# Patient Record
Sex: Male | Born: 1955 | Race: White | Hispanic: No | Marital: Married | Smoking: Never smoker
Health system: Southern US, Community
[De-identification: ages and names within clinical notes are randomized; demographics above are authoritative.]

## PROBLEM LIST (undated history)

## (undated) DIAGNOSIS — F419 Anxiety disorder, unspecified: Secondary | ICD-10-CM

## (undated) DIAGNOSIS — K219 Gastro-esophageal reflux disease without esophagitis: Secondary | ICD-10-CM

## (undated) DIAGNOSIS — R001 Bradycardia, unspecified: Secondary | ICD-10-CM

## (undated) DIAGNOSIS — K760 Fatty (change of) liver, not elsewhere classified: Secondary | ICD-10-CM

## (undated) DIAGNOSIS — I340 Nonrheumatic mitral (valve) insufficiency: Secondary | ICD-10-CM

## (undated) DIAGNOSIS — M1990 Unspecified osteoarthritis, unspecified site: Secondary | ICD-10-CM

## (undated) DIAGNOSIS — I341 Nonrheumatic mitral (valve) prolapse: Secondary | ICD-10-CM

## (undated) DIAGNOSIS — C801 Malignant (primary) neoplasm, unspecified: Secondary | ICD-10-CM

## (undated) DIAGNOSIS — R519 Headache, unspecified: Secondary | ICD-10-CM

## (undated) DIAGNOSIS — E785 Hyperlipidemia, unspecified: Secondary | ICD-10-CM

## (undated) HISTORY — PX: DRUG INDUCED ENDOSCOPY: SHX6808

## (undated) HISTORY — PX: COLONOSCOPY: SHX174

## (undated) HISTORY — PX: KNEE ARTHROSCOPY: SUR90

## (undated) HISTORY — DX: Fatty (change of) liver, not elsewhere classified: K76.0

## (undated) HISTORY — DX: Bradycardia, unspecified: R00.1

## (undated) HISTORY — DX: Hyperlipidemia, unspecified: E78.5

## (undated) HISTORY — DX: Nonrheumatic mitral (valve) insufficiency: I34.0

## (undated) HISTORY — DX: Nonrheumatic mitral (valve) prolapse: I34.1

## (undated) HISTORY — DX: Gastro-esophageal reflux disease without esophagitis: K21.9

---

## 2002-08-23 HISTORY — PX: US ECHOCARDIOGRAPHY: HXRAD669

## 2008-05-08 HISTORY — PX: US ECHOCARDIOGRAPHY: HXRAD669

## 2009-09-18 ENCOUNTER — Ambulatory Visit: Payer: Self-pay | Admitting: Cardiovascular Disease

## 2009-09-20 ENCOUNTER — Ambulatory Visit: Payer: Self-pay | Admitting: Cardiovascular Disease

## 2010-10-06 ENCOUNTER — Encounter: Payer: Self-pay | Admitting: Cardiovascular Disease

## 2010-10-07 ENCOUNTER — Ambulatory Visit (INDEPENDENT_AMBULATORY_CARE_PROVIDER_SITE_OTHER): Payer: BC Managed Care – PPO | Admitting: Nurse Practitioner

## 2010-10-07 ENCOUNTER — Encounter: Payer: Self-pay | Admitting: Nurse Practitioner

## 2010-10-07 ENCOUNTER — Ambulatory Visit (INDEPENDENT_AMBULATORY_CARE_PROVIDER_SITE_OTHER): Payer: BC Managed Care – PPO | Admitting: *Deleted

## 2010-10-07 VITALS — BP 128/78 | HR 39 | Ht 71.0 in | Wt 174.0 lb

## 2010-10-07 DIAGNOSIS — R001 Bradycardia, unspecified: Secondary | ICD-10-CM | POA: Insufficient documentation

## 2010-10-07 DIAGNOSIS — I341 Nonrheumatic mitral (valve) prolapse: Secondary | ICD-10-CM | POA: Insufficient documentation

## 2010-10-07 DIAGNOSIS — I059 Rheumatic mitral valve disease, unspecified: Secondary | ICD-10-CM

## 2010-10-07 DIAGNOSIS — I498 Other specified cardiac arrhythmias: Secondary | ICD-10-CM

## 2010-10-07 DIAGNOSIS — E7800 Pure hypercholesterolemia, unspecified: Secondary | ICD-10-CM

## 2010-10-07 DIAGNOSIS — E785 Hyperlipidemia, unspecified: Secondary | ICD-10-CM | POA: Insufficient documentation

## 2010-10-07 LAB — LIPID PANEL
Cholesterol: 151 mg/dL (ref 0–200)
HDL: 49.9 mg/dL (ref >39.00)
LDL Cholesterol: 88 mg/dL (ref 0–99)
Total CHOL/HDL Ratio: 3
Triglycerides: 64 mg/dL (ref 0.0–149.0)
VLDL: 12.8 mg/dL (ref 0.0–40.0)

## 2010-10-07 LAB — BASIC METABOLIC PANEL
BUN: 18 mg/dL (ref 6–23)
CO2: 30 mEq/L (ref 19–32)
Calcium: 9.7 mg/dL (ref 8.4–10.5)
Chloride: 105 mEq/L (ref 96–112)
Creatinine, Ser: 1.2 mg/dL (ref 0.4–1.5)
GFR: 69.5 mL/min (ref >60.00)
Glucose, Bld: 101 mg/dL — ABNORMAL HIGH (ref 70–99)
Potassium: 5.5 mEq/L — ABNORMAL HIGH (ref 3.5–5.1)
Sodium: 141 mEq/L (ref 135–145)

## 2010-10-07 LAB — HEPATIC FUNCTION PANEL
ALT: 17 U/L (ref 0–53)
AST: 20 U/L (ref 0–37)
Albumin: 4 g/dL (ref 3.5–5.2)
Alkaline Phosphatase: 68 U/L (ref 39–117)
Bilirubin, Direct: 0.1 mg/dL (ref 0.0–0.3)
Total Bilirubin: 0.8 mg/dL (ref 0.3–1.2)
Total Protein: 7 g/dL (ref 6.0–8.3)

## 2010-10-07 MED ORDER — ATORVASTATIN CALCIUM 40 MG PO TABS: 40.0000 mg | ORAL_TABLET | ORAL | Status: DC

## 2010-10-07 NOTE — Patient Instructions (Addendum)
Stay on your regular medicines I have refilled your Lipitor to the drug store We will see you back in a year Call for any problems. Heart rate remains on the low side, call and let us know if you have any dizzy spells or lightheadedness

## 2010-10-07 NOTE — Assessment & Plan Note (Signed)
Asymptomatic. Last echo was in 2010.

## 2010-10-07 NOTE — Assessment & Plan Note (Signed)
Labs are checked today. Lipitor is refilled.

## 2010-10-07 NOTE — Assessment & Plan Note (Signed)
He is asymptomatic. He remains very active. He will call and let us know if any symptoms such as dizziness or lightheadedness occurs. We will otherwise see him back in one year.  Patient is agreeable to this plan and will call if any problems develop in the interim.

## 2010-10-07 NOTE — Progress Notes (Signed)
    Carloyn Jaeger Date of Birth: [birth year]   History of Present Illness: Loraine Leriche is seen back today for a one year check. He is seen for Dr. Elease Hashimoto. He continues to do well. No chest pain or shortness of breath. He continues to run on a regular basis and feels good. Heart rate remains slow but he is totally asymptomatic. No dizziness or lightheadedness. No syncope.   Current Outpatient Prescriptions on File Prior to Visit  Medication Sig Dispense Refill  . aspirin 81 MG tablet Take 81 mg by mouth daily.        . BusPIRone HCl (BUSPAR PO) Take 2.5 mg by mouth daily.       . Cetirizine HCl (ZYRTEC PO) Take by mouth daily.        Marland Kitchen DISCONTD: atorvastatin (LIPITOR) 40 MG tablet Take 40 mg by mouth every other day.         No Known Allergies  Past Medical History  Diagnosis Date  . Hyperlipidemia   . MVP (mitral valve prolapse)     Echo in 2010 with minimal prolapse of the posterior leaflet and mild MR, trace TR.   . Mitral regurgitation     mild per echo in 2010  . GERD (gastroesophageal reflux disease)   . Bradycardia     asymptomatic    Past Surgical History  Procedure Date  . US echocardiography 05/08/2008    EF 55-60%  . US echocardiography 08/23/2002    EF 60-65%    History  Smoking status  . Never Smoker   Smokeless tobacco  . Not on file    History  Alcohol Use  . Yes    social use    History reviewed. No pertinent family history.  Review of Systems: The review of systems is as above.  All other systems were reviewed and are negative.  Physical Exam: BP 128/78  Pulse 39  Ht 5\' 11"  (1.803 m)  Wt 174 lb (78.926 kg)  BMI 24.27 kg/m2 Patient is very pleasant and in no acute distress. Skin is warm and dry. Color is normal.  HEENT is unremarkable. Normocephalic/atraumatic. PERRL. Sclera are nonicteric. Neck is supple. No masses. No JVD. Lungs are clear. Cardiac exam shows a regular rate and rhythm. No murmur that I could appreciate.  Abdomen is soft.  Extremities are without edema. Gait and ROM are intact. No gross neurologic deficits noted.   LABORATORY DATA: EKG shows sinus bradycardia. Rate is 39. Tracing was reviewed with Dr. Patty Sermons in the office. Other labs are pending.  Assessment / Plan:

## 2010-10-08 ENCOUNTER — Telehealth: Payer: Self-pay | Admitting: *Deleted

## 2010-10-08 DIAGNOSIS — E785 Hyperlipidemia, unspecified: Secondary | ICD-10-CM

## 2010-10-08 NOTE — Telephone Encounter (Signed)
Patient called with lab results. Pt verbalized understanding. 6 mo labs scheduled, Alfonso Ramus RN

## 2010-10-08 NOTE — Progress Notes (Signed)
Called home number, no answer will continue to try to make contact.

## 2011-03-13 ENCOUNTER — Other Ambulatory Visit (INDEPENDENT_AMBULATORY_CARE_PROVIDER_SITE_OTHER): Payer: BC Managed Care – PPO

## 2011-03-13 DIAGNOSIS — E785 Hyperlipidemia, unspecified: Secondary | ICD-10-CM

## 2011-03-13 LAB — HEPATIC FUNCTION PANEL
ALT: 18 U/L (ref 0–53)
AST: 19 U/L (ref 0–37)
Albumin: 4 g/dL (ref 3.5–5.2)
Alkaline Phosphatase: 60 U/L (ref 39–117)
Bilirubin, Direct: 0.1 mg/dL (ref 0.0–0.3)
Total Bilirubin: 0.7 mg/dL (ref 0.3–1.2)
Total Protein: 6.9 g/dL (ref 6.0–8.3)

## 2011-03-13 LAB — LIPID PANEL
Cholesterol: 158 mg/dL (ref 0–200)
HDL: 48.3 mg/dL (ref >39.00)
LDL Cholesterol: 92 mg/dL (ref 0–99)
Total CHOL/HDL Ratio: 3
Triglycerides: 91 mg/dL (ref 0.0–149.0)
VLDL: 18.2 mg/dL (ref 0.0–40.0)

## 2011-03-13 LAB — BASIC METABOLIC PANEL
BUN: 19 mg/dL (ref 6–23)
CO2: 27 mEq/L (ref 19–32)
Calcium: 9.1 mg/dL (ref 8.4–10.5)
Chloride: 105 mEq/L (ref 96–112)
Creatinine, Ser: 1.1 mg/dL (ref 0.4–1.5)
GFR: 74.56 mL/min (ref >60.00)
Glucose, Bld: 93 mg/dL (ref 70–99)
Potassium: 4.3 mEq/L (ref 3.5–5.1)
Sodium: 138 mEq/L (ref 135–145)

## 2011-10-11 ENCOUNTER — Other Ambulatory Visit: Payer: Self-pay | Admitting: Nurse Practitioner

## 2012-03-02 ENCOUNTER — Encounter: Payer: Self-pay | Admitting: Cardiovascular Disease

## 2012-04-21 ENCOUNTER — Ambulatory Visit (INDEPENDENT_AMBULATORY_CARE_PROVIDER_SITE_OTHER): Payer: BC Managed Care – PPO | Admitting: Cardiovascular Disease

## 2012-04-21 ENCOUNTER — Encounter: Payer: Self-pay | Admitting: Cardiovascular Disease

## 2012-04-21 VITALS — BP 130/70 | HR 44 | Ht 71.0 in | Wt 178.8 lb

## 2012-04-21 DIAGNOSIS — I059 Rheumatic mitral valve disease, unspecified: Secondary | ICD-10-CM

## 2012-04-21 DIAGNOSIS — R001 Bradycardia, unspecified: Secondary | ICD-10-CM

## 2012-04-21 DIAGNOSIS — I341 Nonrheumatic mitral (valve) prolapse: Secondary | ICD-10-CM

## 2012-04-21 DIAGNOSIS — E785 Hyperlipidemia, unspecified: Secondary | ICD-10-CM

## 2012-04-21 DIAGNOSIS — I498 Other specified cardiac arrhythmias: Secondary | ICD-10-CM

## 2012-04-21 MED ORDER — ATORVASTATIN CALCIUM 40 MG PO TABS: 40.0000 mg | ORAL_TABLET | Freq: Every day | ORAL | Status: DC

## 2012-04-21 NOTE — Assessment & Plan Note (Addendum)
Kevin Reyes is doing very well. I have encouraged him to continue with his current medications. He also exercises on a regular basis. I'll see him again in one year.

## 2012-04-21 NOTE — Assessment & Plan Note (Signed)
He has stable sinus bradycardia.  No changes

## 2012-04-21 NOTE — Patient Instructions (Addendum)
Your physician recommends that you return for a FASTING lipid profile: TODAY AND IN 1 YEAR   Your physician wants you to follow-up in: 1 YEAR  You will receive a reminder letter in the mail two months in advance. If you don't receive a letter, please call our office to schedule the follow-up appointment.  Your physician recommends that you continue on your current medications as directed. Please refer to the Current Medication list given to you today.    

## 2012-04-21 NOTE — Assessment & Plan Note (Signed)
His mitral valve remains stable.   I Anticipate getting another echocardiogram in the next 5 years or so. He has very mild mitral valve prolapse with mild mitral regurgitation. I do not think that he'll ever need to have his mitral valve repaired.

## 2012-04-21 NOTE — Progress Notes (Signed)
     Carloyn Jaeger Date of Birth  December 01, 1955       [facility]    [employer] [address]  [address] [place], [place]  [address]   [place], [place]  [address] [phone]     [phone]   Fax  [phone]    Fax [phone]  Problem List: 1. hyperlipidemia 2. Much by prolapse with mild mitral regurgitation.  History of Present Illness:  Loraine Leriche is a healthy 36 her gentleman with known for many years. We have seen him in the past for hyperlipidemia and mitral valve prolapse. He was last seen by Lawson Fiscal in 2012. He's done well since I last saw him. He continues to run on a regular basis. He's not had any episodes of chest pain or shortness of breath.  He has not had any chest pain or dyspnea.     Current Outpatient Prescriptions on File Prior to Visit  Medication Sig Dispense Refill  . aspirin 81 MG tablet Take 81 mg by mouth daily.        . BusPIRone HCl (BUSPAR PO) Take 2.5 mg by mouth daily.       . Cetirizine HCl (ZYRTEC PO) Take 10 mg by mouth daily.       Marland Kitchen atorvastatin (LIPITOR) 40 MG tablet TAKE 1 TABLET EVERY OTHER DAY  30 tablet  3   No current facility-administered medications on file prior to visit.    No Known Allergies  Past Medical History  Diagnosis Date  . Hyperlipidemia   . MVP (mitral valve prolapse)     Echo in 2010 with minimal prolapse of the posterior leaflet and mild MR, trace TR.   . Mitral regurgitation     mild per echo in 2010  . GERD (gastroesophageal reflux disease)   . Bradycardia     asymptomatic    Past Surgical History  Procedure Laterality Date  . US echocardiography  05/08/2008    EF 55-60%  . US echocardiography  08/23/2002    EF 60-65%    History  Smoking status  . Never Smoker   Smokeless tobacco  . Not on file    History  Alcohol Use  . Yes    Comment: social use    No family history on file.  Reviw of Systems:  Reviewed in the HPI.  All other systems are  negative.  Physical Exam: Blood pressure 130/70, pulse 44, height 5\' 11"  (1.803 m), weight 178 lb 12.8 oz (81.103 kg). General: Well developed, well nourished, in no acute distress.  Head: Normocephalic, atraumatic, sclera non-icteric, mucus membranes are moist,   Neck: Supple. Carotids are 2 + without bruits. No JVD   Lungs: Clear   Heart: RR, normal S1, S2.  He has a soft systolic murmur  Abdomen: Soft, non-tender, non-distended with normal bowel sounds.  Msk:  Strength and tone are normal   Extremities: No clubbing or cyanosis. No edema.  Distal pedal pulses are 2+ and equal    Neuro: CN II - XII intact.  Alert and oriented X 3.   Psych:  Normal   ECG: April 21, 2012:  Sinus bradycardia at a rate of 44. He has no ST or T wave changes.  Assessment / Plan:

## 2012-04-22 LAB — HEPATIC FUNCTION PANEL
ALT: 19 U/L (ref 0–53)
Albumin: 4.2 g/dL (ref 3.5–5.2)
Total Bilirubin: 0.6 mg/dL (ref 0.3–1.2)

## 2012-04-22 LAB — BASIC METABOLIC PANEL
CO2: 25 mEq/L (ref 19–32)
GFR: 71.97 mL/min (ref >60.00)
Glucose, Bld: 79 mg/dL (ref 70–99)
Potassium: 4.6 mEq/L (ref 3.5–5.1)
Sodium: 135 mEq/L (ref 135–145)

## 2012-04-22 LAB — LIPID PANEL
HDL: 42.5 mg/dL (ref >39.00)
Total CHOL/HDL Ratio: 4
VLDL: 24.8 mg/dL (ref 0.0–40.0)

## 2012-06-16 ENCOUNTER — Ambulatory Visit (INDEPENDENT_AMBULATORY_CARE_PROVIDER_SITE_OTHER): Payer: BC Managed Care – PPO | Admitting: Family Medicine

## 2012-06-16 ENCOUNTER — Encounter: Payer: Self-pay | Admitting: Family Medicine

## 2012-06-16 VITALS — BP 116/74 | HR 40 | Ht 71.0 in | Wt 170.0 lb

## 2012-06-16 DIAGNOSIS — M7740 Metatarsalgia, unspecified foot: Secondary | ICD-10-CM | POA: Insufficient documentation

## 2012-06-16 DIAGNOSIS — M722 Plantar fascial fibromatosis: Secondary | ICD-10-CM | POA: Insufficient documentation

## 2012-06-16 DIAGNOSIS — M7750 Other enthesopathy of unspecified foot: Secondary | ICD-10-CM

## 2012-06-16 NOTE — Assessment & Plan Note (Signed)
metatarsal pads added to sports insoles - felt comfortable.

## 2012-06-16 NOTE — Progress Notes (Signed)
Patient ID: Kevin Reyes, male   DOB: 1955/09/15, 57 y.o.   MRN: [id number]  PCP: Daisy Floro, MD  Subjective:   HPI: Patient is a 57 y.o. male here for right heel pain.  Patient denies known injury to start current issues. States for a few weeks has had pain on bottom of right heel. Runs about 2-3 times a week, 3-4 miles at a time. During a later run he did feel some increased pain in bottom of heel but started initially insidiously. Taking ibuprofen, tried otc dr. Jari Sportsman 2/3rds gel insert. Feels tightness in lower calf also. Has problems with MT heads dropped - has a pair of other inserts that have MT pads in them. Has stopped running due to the pain.  Past Medical History  Diagnosis Date  . Hyperlipidemia   . MVP (mitral valve prolapse)     Echo in 2010 with minimal prolapse of the posterior leaflet and mild MR, trace TR.   . Mitral regurgitation     mild per echo in 2010  . GERD (gastroesophageal reflux disease)   . Bradycardia     asymptomatic    Current Outpatient Prescriptions on File Prior to Visit  Medication Sig Dispense Refill  . atorvastatin (LIPITOR) 40 MG tablet Take 1 tablet (40 mg total) by mouth daily.  30 tablet  11  . BusPIRone HCl (BUSPAR PO) Take 2.5 mg by mouth daily.       . Cetirizine HCl (ZYRTEC PO) Take 10 mg by mouth daily.       Marland Kitchen aspirin 81 MG tablet Take 81 mg by mouth daily.         No current facility-administered medications on file prior to visit.    Past Surgical History  Procedure Laterality Date  . US echocardiography  05/08/2008    EF 55-60%  . US echocardiography  08/23/2002    EF 60-65%    No Known Allergies  History   Social History  . Marital Status: Single    Spouse Name: N/A    Number of Children: N/A  . Years of Education: N/A   Occupational History  . Not on file.   Social History Main Topics  . Smoking status: Never Smoker   . Smokeless tobacco: Not on file  . Alcohol Use: Yes     Comment: social  use  . Drug Use: No  . Sexually Active: Not on file   Other Topics Concern  . Not on file   Social History Narrative  . No narrative on file    Family History  Problem Relation Age of Onset  . Hypertension Mother   . Sudden death Neg Hx   . Hyperlipidemia Neg Hx   . Heart attack Neg Hx   . Diabetes Neg Hx     BP 116/74  Pulse 40  Ht 5\' 11"  (1.803 m)  Wt 170 lb (77.111 kg)  BMI 23.72 kg/m2  Review of Systems: See HPI above.    Objective:  Physical Exam: Gen: NAD  R foot/ankle: No gross deformity, swelling, ecchymoses.  Mod overpronation. Transverse arch collapse bilaterally but not callus formation - left 4th much more collapsed then 2nd-4th MTs, right 2nd-4th collapsed. FROM ankle with 5/5 strength all directions, no pain. TTP plantar medial calcaneus at PF insertion. Negative ant drawer and talar tilt.   Negative syndesmotic compression. Thompsons test negative. NV intact distally.    Assessment & Plan:  1. Right plantar fasciitis - shown home exercise program to  do daily.  Arch binders, sports insoles with MT pads (see below).  Icing, tylenol/nsaids as needed.  Avoid flat shoes, barefoot walking.  Consider injection if not improving as expected.  2. Metatarsalgia - metatarsal pads added to sports insoles - felt comfortable.

## 2012-06-16 NOTE — Assessment & Plan Note (Signed)
Right plantar fasciitis - shown home exercise program to do daily.  Arch binders, sports insoles with MT pads (see below).  Icing, tylenol/nsaids as needed.  Avoid flat shoes, barefoot walking.  Consider injection if not improving as expected.

## 2012-06-16 NOTE — Patient Instructions (Addendum)
You have plantar fasciitis and metatarsalgia. Wear sports insoles with metatarsal pads when up and walking around - can put these in and out of different shoes. Take tylenol or aleve as needed for pain  Plantar fascia stretch for 20-30 seconds (do 3 of these) in morning Lowering/raise on a step exercises 3 x 10 once or twice a day - this is very important for long term recovery. Can add heel walks, toe walks forward and backward as well Ice heel for 15 minutes as needed. Avoid flat shoes/barefoot walking as much as possible. Arch straps have been shown to help with pain. Steroid injection is a consideration for short term pain relief if you are struggling. Physical therapy is also an option. Follow up with me in 6 weeks for reevaluation.

## 2013-05-02 ENCOUNTER — Ambulatory Visit (INDEPENDENT_AMBULATORY_CARE_PROVIDER_SITE_OTHER): Payer: BC Managed Care – PPO | Admitting: Cardiovascular Disease

## 2013-05-02 ENCOUNTER — Encounter: Payer: Self-pay | Admitting: Cardiovascular Disease

## 2013-05-02 ENCOUNTER — Other Ambulatory Visit: Payer: BC Managed Care – PPO

## 2013-05-02 VITALS — BP 118/62 | HR 45 | Ht 71.0 in | Wt 178.0 lb

## 2013-05-02 DIAGNOSIS — I341 Nonrheumatic mitral (valve) prolapse: Secondary | ICD-10-CM

## 2013-05-02 DIAGNOSIS — I059 Rheumatic mitral valve disease, unspecified: Secondary | ICD-10-CM

## 2013-05-02 DIAGNOSIS — E785 Hyperlipidemia, unspecified: Secondary | ICD-10-CM

## 2013-05-02 LAB — HEPATIC FUNCTION PANEL
ALT: 22 U/L (ref 0–53)
AST: 23 U/L (ref 0–37)
Albumin: 4 g/dL (ref 3.5–5.2)
Alkaline Phosphatase: 58 U/L (ref 39–117)
BILIRUBIN DIRECT: 0.1 mg/dL (ref 0.0–0.3)
BILIRUBIN TOTAL: 0.9 mg/dL (ref 0.3–1.2)
Total Protein: 7.1 g/dL (ref 6.0–8.3)

## 2013-05-02 LAB — BASIC METABOLIC PANEL
BUN: 21 mg/dL (ref 6–23)
CO2: 28 meq/L (ref 19–32)
Calcium: 9.1 mg/dL (ref 8.4–10.5)
Chloride: 104 mEq/L (ref 96–112)
Creatinine, Ser: 1 mg/dL (ref 0.4–1.5)
GFR: 85.67 mL/min (ref >60.00)
GLUCOSE: 90 mg/dL (ref 70–99)
POTASSIUM: 4.3 meq/L (ref 3.5–5.1)
SODIUM: 139 meq/L (ref 135–145)

## 2013-05-02 LAB — LIPID PANEL
CHOL/HDL RATIO: 3
Cholesterol: 135 mg/dL (ref 0–200)
HDL: 44.3 mg/dL (ref >39.00)
LDL Cholesterol: 72 mg/dL (ref 0–99)
Triglycerides: 95 mg/dL (ref 0.0–149.0)
VLDL: 19 mg/dL (ref 0.0–40.0)

## 2013-05-02 MED ORDER — ATORVASTATIN CALCIUM 20 MG PO TABS: 20.0000 mg | ORAL_TABLET | Freq: Every day | ORAL | Status: DC

## 2013-05-02 NOTE — Assessment & Plan Note (Signed)
Kevin Reyes is doing very well.  We'll change his atorvastatin 20 mg a day.  We'll check labs today. I'll see him again in one year for followup office visit and fasting labs.

## 2013-05-02 NOTE — Patient Instructions (Signed)
Your physician recommends that you return have lab work:  TODAY  Your physician has recommended you make the following change in your medication:  Atorvastatin 20 mg once daily  Your physician wants you to follow-up in: 1 year with fasting lab work. You will receive a reminder letter in the mail two months in advance. If you don't receive a letter, please call our office to schedule the follow-up appointment. You will need to fast for this appointment - nothing to eat or drink after midnight the night before except water

## 2013-05-02 NOTE — Assessment & Plan Note (Signed)
Stable No changes 

## 2013-05-02 NOTE — Progress Notes (Signed)
Kevin Reyes Date of Birth  27-Apr-1955       [facility]    [employer] [address], [place], [place]  [address]   [place], [place]  [address] [phone]     [phone]   Fax  [phone]    Fax [phone]  Problem List: 1. hyperlipidemia 2. Much by prolapse with mild mitral regurgitation.  History of Present Illness:  Kevin Reyes is a healthy 67 her gentleman with known for many years. We have seen him in the past for hyperlipidemia and mitral valve prolapse. He was last seen by [name] in 2012. He's done well since I last saw him. He continues to run on a regular basis. He's not had any episodes of chest pain or shortness of breath.  He has not had any chest pain or dyspnea.     May 02, 2013:  Kevin Reyes is doing well.  He stopped his ASA due to some stomach issues.   He still exercising on regular basis.  Current Outpatient Prescriptions on File Prior to Visit  Medication Sig Dispense Refill  . atorvastatin (LIPITOR) 40 MG tablet Take 1 tablet (40 mg total) by mouth daily.  30 tablet  11  . BusPIRone HCl (BUSPAR PO) Take 2.5 mg by mouth daily.       . Cetirizine HCl (ZYRTEC PO) Take 10 mg by mouth daily.        No current facility-administered medications on file prior to visit.    No Known Allergies  Past Medical History  Diagnosis Date  . Hyperlipidemia   . MVP (mitral valve prolapse)     Echo in 2010 with minimal prolapse of the posterior leaflet and mild MR, trace TR.   . Mitral regurgitation     mild per echo in 2010  . GERD (gastroesophageal reflux disease)   . Bradycardia     asymptomatic    Past Surgical History  Procedure Laterality Date  . US echocardiography  05/08/2008    EF 55-60%  . US echocardiography  08/23/2002    EF 60-65%    History  Smoking status  . Never Smoker   Smokeless tobacco  . Not on file    History  Alcohol Use  . Yes    Comment: social use    Family  History  Problem Relation Age of Onset  . Hypertension Mother   . Sudden death Neg Hx   . Hyperlipidemia Neg Hx   . Heart attack Neg Hx   . Diabetes Neg Hx     Reviw of Systems:  Reviewed in the HPI.  All other systems are negative.  Physical Exam: Blood pressure 118/62, pulse 45, height 5\' 11"  (1.803 m), weight 178 lb (80.74 kg). General: Well developed, well nourished, in no acute distress.  Head: Normocephalic, atraumatic, sclera non-icteric, mucus membranes are moist,   Neck: Supple. Carotids are 2 + without bruits. No JVD   Lungs: Clear   Heart: RR, normal S1, S2.  He has a soft systolic murmur  Abdomen: Soft, non-tender, non-distended with normal bowel sounds.  Msk:  Strength and tone are normal   Extremities: No clubbing or cyanosis. No edema.  Distal pedal pulses are 2+ and equal    Neuro: CN II - XII intact.  Alert and oriented X 3.   Psych:  Normal   ECG: May 02, 2013:  Sinus bradycardia at 45.  Otherwise normal ECG.Marland Kitchen  Assessment / Plan:

## 2013-05-03 ENCOUNTER — Other Ambulatory Visit: Payer: BC Managed Care – PPO

## 2014-03-05 ENCOUNTER — Telehealth: Payer: Self-pay | Admitting: Cardiovascular Disease

## 2014-03-05 ENCOUNTER — Other Ambulatory Visit: Payer: Self-pay | Admitting: Nurse Practitioner

## 2014-03-05 DIAGNOSIS — E785 Hyperlipidemia, unspecified: Secondary | ICD-10-CM

## 2014-03-05 NOTE — Telephone Encounter (Signed)
Noted; orders in epic and linked with patient's lab appointment

## 2014-03-05 NOTE — Telephone Encounter (Signed)
New problem    Pt need orders entered in for fasting labs per recall. I've already scheduled an appt for labs.

## 2014-05-08 NOTE — Progress Notes (Signed)
Cardiology Office Note   Date:  05/08/2014   ID:  Kevin Reyes, Kevin Reyes 1955/10/09, MRN [id number]  PCP:   Kevin Crutch, MD  Cardiologist:   Kevin Fredrickson Wonda Cheng, MD   Chief Complaint  Patient presents with  . Follow-up   Problem List  1. hyperlipidemia 2. Mitral Valve prolapse with mild mitral regurgitation.  History of Present Illness:  Kevin Reyes is a healthy 77 her gentleman with known for many years. We have seen him in the past for hyperlipidemia and mitral valve prolapse. He was last seen by [name] in 2012. He's done well since I last saw him. He continues to run on a regular basis. He's not had any episodes of chest pain or shortness of breath.  He has not had any chest pain or dyspnea.   May 02, 2013:  Kevin Reyes is doing well. He stopped his ASA due to some stomach issues. He still exercising on regular basis.  May 09, 2014 Kevin Reyes is a 59 y.o. male who presents for MVP and HTN Having some headaches. Still running.  Lifting weights regularly.    Past Medical History  Diagnosis Date  . Hyperlipidemia   . MVP (mitral valve prolapse)     Echo in 2010 with minimal prolapse of the posterior leaflet and mild MR, trace TR.   . Mitral regurgitation     mild per echo in 2010  . GERD (gastroesophageal reflux disease)   . Bradycardia     asymptomatic    Past Surgical History  Procedure Laterality Date  . US echocardiography  05/08/2008    EF 55-60%  . US echocardiography  08/23/2002    EF 60-65%     Current Outpatient Prescriptions  Medication Sig Dispense Refill  . atorvastatin (LIPITOR) 20 MG tablet Take 1 tablet (20 mg total) by mouth daily at 6 PM. 90 tablet 3  . BusPIRone HCl (BUSPAR PO) Take 2.5 mg by mouth daily.     . Cetirizine HCl (ZYRTEC PO) Take 10 mg by mouth daily.      No current facility-administered medications for this visit.    Allergies:   Review of patient's allergies indicates no known allergies.    Social History:  The patient   reports that he has never smoked. He does not have any smokeless tobacco history on file. He reports that he drinks alcohol. He reports that he does not use illicit drugs.   Family History:  The patient's family history includes Hypertension in his mother. There is no history of Sudden death, Hyperlipidemia, Heart attack, or Diabetes.    ROS:  Please see the history of present illness.    Review of Systems: Constitutional:  denies fever, chills, diaphoresis, appetite change and fatigue.  HEENT: denies photophobia, eye pain, redness, hearing loss, ear pain, congestion, sore throat, rhinorrhea, sneezing, neck pain, neck stiffness and tinnitus.  Respiratory: denies SOB, DOE, cough, chest tightness, and wheezing.  Cardiovascular: denies chest pain, palpitations and leg swelling.  Gastrointestinal: denies nausea, vomiting, abdominal pain, diarrhea, constipation, blood in stool.  Genitourinary: denies dysuria, urgency, frequency, hematuria, flank pain and difficulty urinating.  Musculoskeletal: denies  myalgias, back pain, joint swelling, arthralgias and gait problem.   Skin: denies pallor, rash and wound.  Neurological: denies dizziness, seizures, syncope, weakness, light-headedness, numbness and headaches.   Hematological: denies adenopathy, easy bruising, personal or family bleeding history.  Psychiatric/ Behavioral: denies suicidal ideation, mood changes, confusion, nervousness, sleep disturbance and agitation.  All other systems are reviewed and negative.    PHYSICAL EXAM: VS:  There were no vitals taken for this visit. , BMI There is no weight on file to calculate BMI. GEN: Well nourished, well developed, in no acute distress HEENT: normal Neck: no JVD, carotid bruits, or masses Cardiac: RRR; no murmurs, rubs, or gallops,no edema  Respiratory:  clear to auscultation bilaterally, normal work of breathing GI: soft, nontender, nondistended, + BS MS: no deformity or atrophy Skin:  warm and dry, no rash Neuro:  Strength and sensation are intact Psych: normal   EKG:  EKG is ordered today. The ekg ordered today demonstrates sinus bradycardia at 38. The EKG is otherwise normal.   Recent Labs: No results found for requested labs within last 365 days.    Lipid Panel    Component Value Date/Time   CHOL 135 05/02/2013 0949   TRIG 95.0 05/02/2013 0949   HDL 44.30 05/02/2013 0949   CHOLHDL 3 05/02/2013 0949   VLDL 19.0 05/02/2013 0949   LDLCALC 72 05/02/2013 0949      Wt Readings from Last 3 Encounters:  05/02/13 178 lb (80.74 kg)  06/16/12 170 lb (77.111 kg)  04/21/12 178 lb 12.8 oz (81.103 kg)      Other studies Reviewed: Additional studies/ records that were reviewed today include: . Review of the above records demonstrates:    ASSESSMENT AND PLAN:  1. Hyperlipidemia - continue atorvastatin Labs checked.  2. Mitral Valve prolapse with mild mitral regurgitation. Exam is uncha   Current medicines are reviewed at length with the patient today.  The patient does not have concerns regarding medicines.  The following changes have been made:  no change  Labs/ tests ordered today include:  No orders of the defined types were placed in this encounter.     Disposition:   FU with me in 1 year     Signed, Nahser, Wonda Cheng, MD  05/08/2014 9:02 PM    [facility] [place], [place], [place]  [address] Phone: [phone]; Fax: [phone]

## 2014-05-09 ENCOUNTER — Encounter: Payer: Self-pay | Admitting: Cardiovascular Disease

## 2014-05-09 ENCOUNTER — Other Ambulatory Visit (INDEPENDENT_AMBULATORY_CARE_PROVIDER_SITE_OTHER): Payer: BLUE CROSS/BLUE SHIELD | Admitting: *Deleted

## 2014-05-09 ENCOUNTER — Ambulatory Visit (INDEPENDENT_AMBULATORY_CARE_PROVIDER_SITE_OTHER): Payer: BLUE CROSS/BLUE SHIELD | Admitting: Cardiovascular Disease

## 2014-05-09 VITALS — BP 118/60 | HR 38 | Ht 71.0 in | Wt 184.8 lb

## 2014-05-09 DIAGNOSIS — I341 Nonrheumatic mitral (valve) prolapse: Secondary | ICD-10-CM

## 2014-05-09 DIAGNOSIS — E785 Hyperlipidemia, unspecified: Secondary | ICD-10-CM | POA: Diagnosis not present

## 2014-05-09 DIAGNOSIS — R001 Bradycardia, unspecified: Secondary | ICD-10-CM

## 2014-05-09 LAB — BASIC METABOLIC PANEL
BUN: 18 mg/dL (ref 6–23)
CALCIUM: 9 mg/dL (ref 8.4–10.5)
CO2: 28 meq/L (ref 19–32)
Chloride: 105 mEq/L (ref 96–112)
Creatinine, Ser: 1.5 mg/dL (ref 0.40–1.50)
GFR: 51 mL/min — AB (ref >60.00)
Glucose, Bld: 92 mg/dL (ref 70–99)
POTASSIUM: 4.2 meq/L (ref 3.5–5.1)
Sodium: 137 mEq/L (ref 135–145)

## 2014-05-09 LAB — HEPATIC FUNCTION PANEL
ALBUMIN: 4 g/dL (ref 3.5–5.2)
ALT: 21 U/L (ref 0–53)
AST: 13 U/L (ref 0–37)
Alkaline Phosphatase: 66 U/L (ref 39–117)
BILIRUBIN TOTAL: 0.5 mg/dL (ref 0.2–1.2)
Bilirubin, Direct: 0.1 mg/dL (ref 0.0–0.3)
Total Protein: 6.4 g/dL (ref 6.0–8.3)

## 2014-05-09 LAB — LIPID PANEL
CHOL/HDL RATIO: 3
Cholesterol: 144 mg/dL (ref 0–200)
HDL: 43.9 mg/dL (ref >39.00)
LDL Cholesterol: 81 mg/dL (ref 0–99)
NONHDL: 100.1
Triglycerides: 94 mg/dL (ref 0.0–149.0)
VLDL: 18.8 mg/dL (ref 0.0–40.0)

## 2014-05-09 MED ORDER — ATORVASTATIN CALCIUM 20 MG PO TABS: 20.0000 mg | ORAL_TABLET | Freq: Every day | ORAL | Status: DC

## 2014-05-09 NOTE — Patient Instructions (Signed)
Medication Instructions:  Your physician recommends that you continue on your current medications as directed. Please refer to the Current Medication list given to you today.   Labwork: Our office will call you with the results of the lab work you had today  Testing/Procedures: None  Follow-Up: Your physician wants you to follow-up in: 1 year with Dr. Acie Fredrickson.  You will receive a reminder letter in the mail two months in advance. If you don't receive a letter, please call our office to schedule the follow-up appointment.

## 2014-05-14 ENCOUNTER — Other Ambulatory Visit: Payer: Self-pay | Admitting: Cardiovascular Disease

## 2014-10-09 ENCOUNTER — Other Ambulatory Visit: Payer: Self-pay | Admitting: Cardiovascular Disease

## 2015-08-21 DIAGNOSIS — L309 Dermatitis, unspecified: Secondary | ICD-10-CM | POA: Diagnosis not present

## 2015-09-14 ENCOUNTER — Other Ambulatory Visit: Payer: Self-pay | Admitting: Cardiovascular Disease

## 2015-09-14 DIAGNOSIS — E785 Hyperlipidemia, unspecified: Secondary | ICD-10-CM

## 2015-09-14 DIAGNOSIS — I341 Nonrheumatic mitral (valve) prolapse: Secondary | ICD-10-CM

## 2015-09-19 ENCOUNTER — Other Ambulatory Visit: Payer: Self-pay | Admitting: *Deleted

## 2015-09-19 DIAGNOSIS — I341 Nonrheumatic mitral (valve) prolapse: Secondary | ICD-10-CM

## 2015-09-19 DIAGNOSIS — E785 Hyperlipidemia, unspecified: Secondary | ICD-10-CM

## 2015-09-19 MED ORDER — ATORVASTATIN CALCIUM 20 MG PO TABS: 20.0000 mg | ORAL_TABLET | Freq: Every day | ORAL | 0 refills | Status: DC

## 2015-09-23 NOTE — Telephone Encounter (Signed)
Left message for patient to call back to schedule appointment.  May refill #30

## 2015-10-09 DIAGNOSIS — Z23 Encounter for immunization: Secondary | ICD-10-CM | POA: Diagnosis not present

## 2015-12-11 DIAGNOSIS — G44229 Chronic tension-type headache, not intractable: Secondary | ICD-10-CM | POA: Diagnosis not present

## 2015-12-11 DIAGNOSIS — R001 Bradycardia, unspecified: Secondary | ICD-10-CM | POA: Diagnosis not present

## 2016-02-14 ENCOUNTER — Telehealth: Payer: Self-pay | Admitting: Cardiovascular Disease

## 2016-02-14 DIAGNOSIS — E782 Mixed hyperlipidemia: Secondary | ICD-10-CM

## 2016-02-14 NOTE — Telephone Encounter (Signed)
Spoke with patient and advised him that fasting lab work is needed at next appointment.  He is scheduled for lab appointment on 3/13 and office visit with Dr. Acie Fredrickson on 3/15. He thanked me for the call.

## 2016-02-14 NOTE — Telephone Encounter (Signed)
New Message  Pt voice wanting to know if he needs to fast/have labs.

## 2016-02-19 DIAGNOSIS — N400 Enlarged prostate without lower urinary tract symptoms: Secondary | ICD-10-CM | POA: Diagnosis not present

## 2016-02-19 DIAGNOSIS — F411 Generalized anxiety disorder: Secondary | ICD-10-CM | POA: Diagnosis not present

## 2016-02-19 DIAGNOSIS — G44229 Chronic tension-type headache, not intractable: Secondary | ICD-10-CM | POA: Diagnosis not present

## 2016-02-19 DIAGNOSIS — J0100 Acute maxillary sinusitis, unspecified: Secondary | ICD-10-CM | POA: Diagnosis not present

## 2016-03-01 ENCOUNTER — Other Ambulatory Visit: Payer: Self-pay | Admitting: Cardiovascular Disease

## 2016-03-01 DIAGNOSIS — I341 Nonrheumatic mitral (valve) prolapse: Secondary | ICD-10-CM

## 2016-03-01 DIAGNOSIS — E785 Hyperlipidemia, unspecified: Secondary | ICD-10-CM

## 2016-03-19 ENCOUNTER — Encounter: Payer: Self-pay | Admitting: *Deleted

## 2016-03-24 ENCOUNTER — Other Ambulatory Visit: Payer: BLUE CROSS/BLUE SHIELD

## 2016-03-25 ENCOUNTER — Encounter (INDEPENDENT_AMBULATORY_CARE_PROVIDER_SITE_OTHER): Payer: Self-pay

## 2016-03-25 ENCOUNTER — Other Ambulatory Visit: Payer: BLUE CROSS/BLUE SHIELD | Admitting: *Deleted

## 2016-03-25 DIAGNOSIS — E782 Mixed hyperlipidemia: Secondary | ICD-10-CM

## 2016-03-25 LAB — COMPREHENSIVE METABOLIC PANEL
ALBUMIN: 4 g/dL (ref 3.6–4.8)
ALK PHOS: 65 IU/L (ref 39–117)
ALT: 24 IU/L (ref 0–44)
AST: 27 IU/L (ref 0–40)
Albumin/Globulin Ratio: 1.7 (ref 1.2–2.2)
BILIRUBIN TOTAL: 0.7 mg/dL (ref 0.0–1.2)
BUN / CREAT RATIO: 13 (ref 10–24)
BUN: 14 mg/dL (ref 8–27)
CHLORIDE: 101 mmol/L (ref 96–106)
CO2: 23 mmol/L (ref 18–29)
Calcium: 9.4 mg/dL (ref 8.6–10.2)
Creatinine, Ser: 1.06 mg/dL (ref 0.76–1.27)
GFR calc Af Amer: 88 mL/min/{1.73_m2} (ref >59)
GFR calc non Af Amer: 76 mL/min/{1.73_m2} (ref >59)
GLUCOSE: 88 mg/dL (ref 65–99)
Globulin, Total: 2.3 g/dL (ref 1.5–4.5)
Potassium: 4.6 mmol/L (ref 3.5–5.2)
Sodium: 140 mmol/L (ref 134–144)
Total Protein: 6.3 g/dL (ref 6.0–8.5)

## 2016-03-25 LAB — LIPID PANEL
CHOL/HDL RATIO: 3.1 ratio (ref 0.0–5.0)
Cholesterol, Total: 140 mg/dL (ref 100–199)
HDL: 45 mg/dL (ref >39)
LDL CALC: 72 mg/dL (ref 0–99)
Triglycerides: 114 mg/dL (ref 0–149)
VLDL CHOLESTEROL CAL: 23 mg/dL (ref 5–40)

## 2016-03-26 ENCOUNTER — Encounter (INDEPENDENT_AMBULATORY_CARE_PROVIDER_SITE_OTHER): Payer: Self-pay

## 2016-03-26 ENCOUNTER — Encounter: Payer: Self-pay | Admitting: Cardiovascular Disease

## 2016-03-26 ENCOUNTER — Ambulatory Visit (INDEPENDENT_AMBULATORY_CARE_PROVIDER_SITE_OTHER): Payer: BLUE CROSS/BLUE SHIELD | Admitting: Cardiovascular Disease

## 2016-03-26 DIAGNOSIS — E782 Mixed hyperlipidemia: Secondary | ICD-10-CM

## 2016-03-26 DIAGNOSIS — I341 Nonrheumatic mitral (valve) prolapse: Secondary | ICD-10-CM

## 2016-03-26 MED ORDER — ATORVASTATIN CALCIUM 20 MG PO TABS: 20.0000 mg | ORAL_TABLET | Freq: Every day | ORAL | 3 refills | Status: DC

## 2016-03-26 NOTE — Patient Instructions (Signed)
Medication Instructions:  Your physician recommends that you continue on your current medications as directed. Please refer to the Current Medication list given to you today.   Labwork: None Ordered   Testing/Procedures: None Ordered   Follow-Up: Your physician wants you to follow-up in: 1 year with Dr. Nahser.  You will receive a reminder letter in the mail two months in advance. If you don't receive a letter, please call our office to schedule the follow-up appointment.   If you need a refill on your cardiac medications before your next appointment, please call your pharmacy.   Thank you for choosing [facility]! Dawson Hollman, RN [phone]    

## 2016-03-26 NOTE — Progress Notes (Signed)
Cardiology Office Note   Date:  03/26/2016   ID:  Kevin Reyes, Kevin Reyes 1955/05/30, MRN [id number]  PCP:  Melinda Crutch, MD  Cardiologist:   Mertie Moores, MD   Chief Complaint  Patient presents with  . Follow-up    hyperlipidemia    Problem List  1. hyperlipidemia 2. Mitral Valve prolapse with mild mitral regurgitation.  Previous Notes:   Kevin Reyes is a healthy 39 her gentleman with known for many years. We have seen him in the past for hyperlipidemia and mitral valve prolapse. He was last seen by [name] in 2012. He's done well since I last saw him. He continues to run on a regular basis. He's not had any episodes of chest pain or shortness of breath.  He has not had any chest pain or dyspnea.   May 02, 2013:  Kevin Reyes is doing well. He stopped his ASA due to some stomach issues. He still exercising on regular basis.  May 09, 2014 Kevin Reyes is a 61 y.o. male who presents for MVP and HTN Having some headaches. Still running.  Lifting weights regularly.  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 Still jogging some 3-4 times a week .   Does some weights on the off days .   No CP , no dyspnea   Past Medical History:  Diagnosis Date  . Bradycardia    asymptomatic  . GERD (gastroesophageal reflux disease)   . Hyperlipidemia   . Mitral regurgitation    mild per echo in 2010  . MVP (mitral valve prolapse)    Echo in 2010 with minimal prolapse of the posterior leaflet and mild MR, trace TR.     Past Surgical History:  Procedure Laterality Date  . US ECHOCARDIOGRAPHY  05/08/2008   EF 55-60%  . US ECHOCARDIOGRAPHY  08/23/2002   EF 60-65%     Current Outpatient Prescriptions  Medication Sig Dispense Refill  . atorvastatin (LIPITOR) 20 MG tablet Take 1 tablet (20 mg total) by mouth daily at 6 PM. 30 tablet 0  . busPIRone (BUSPAR) 10 MG tablet Take 5 mg by mouth 2 (two) times daily.  3  . Cetirizine HCl (ZYRTEC PO)  Take 10 mg by mouth daily.     . finasteride (PROSCAR) 5 MG tablet Take 5 mg by mouth daily.  3  . fluticasone (FLONASE) 50 MCG/ACT nasal spray Place 1 spray into both nostrils daily.     No current facility-administered medications for this visit.     Allergies:   Patient has no known allergies.    Social History:  The patient  reports that he has never smoked. He has never used smokeless tobacco. He reports that he drinks alcohol. He reports that he does not use drugs.   Family History:  The patient's family history includes Hypertension in his mother.    ROS:  Please see the history of present illness.    Review of Systems: Constitutional:  denies fever, chills, diaphoresis, appetite change and fatigue.  HEENT: denies photophobia, eye pain, redness, hearing loss, ear pain, congestion, sore throat, rhinorrhea, sneezing, neck pain, neck stiffness and tinnitus.  Respiratory: denies SOB, DOE, cough, chest tightness, and wheezing.  Cardiovascular: denies chest pain, palpitations and leg swelling.  Gastrointestinal: denies nausea, vomiting, abdominal pain, diarrhea, constipation, blood in stool.  Genitourinary: denies dysuria, urgency, frequency, hematuria, flank pain and difficulty urinating.  Musculoskeletal: denies  myalgias, back pain, joint swelling, arthralgias and gait problem.   Skin: denies pallor, rash and wound.  Neurological: denies dizziness, seizures, syncope, weakness, light-headedness, numbness and headaches.   Hematological: denies adenopathy, easy bruising, personal or family bleeding history.  Psychiatric/ Behavioral: denies suicidal ideation, mood changes, confusion, nervousness, sleep disturbance and agitation.       All other systems are reviewed and negative.    PHYSICAL EXAM: VS:  BP (!) 116/58 (BP Location: Left Arm, Patient Position: Sitting, Cuff Size: Normal)   Pulse (!) 48   Ht 5\' 11"  (1.803 m)   Wt 183 lb (83 kg)   SpO2 95%   BMI 25.52 kg/m  ,  BMI Body mass index is 25.52 kg/m. GEN: Well nourished, well developed, in no acute distress  HEENT: normal  Neck: no JVD, carotid bruits, or masses Cardiac: RRR; no murmurs, rubs, or gallops,no edema  Respiratory:  clear to auscultation bilaterally, normal work of breathing GI: soft, nontender, nondistended, + BS MS: no deformity or atrophy  Skin: warm and dry, no rash Neuro:  Strength and sensation are intact Psych: normal   EKG:  EKG is ordered today. The ekg ordered today demonstrates sinus bradycardia at 48. The EKG is otherwise normal.   Recent Labs: 03/25/2016: ALT 24; BUN 14; Creatinine, Ser 1.06; Potassium 4.6; Sodium 140    Lipid Panel    Component Value Date/Time   CHOL 140 03/25/2016 0732   TRIG 114 03/25/2016 0732   HDL 45 03/25/2016 0732   CHOLHDL 3.1 03/25/2016 0732   CHOLHDL 3 05/09/2014 0758   VLDL 18.8 05/09/2014 0758   LDLCALC 72 03/25/2016 0732      Wt Readings from Last 3 Encounters:  03/26/16 183 lb (83 kg)  05/09/14 184 lb 12.8 oz (83.8 kg)  05/02/13 178 lb (80.7 kg)      Other studies Reviewed: Additional studies/ records that were reviewed today include: . Review of the above records demonstrates:    ASSESSMENT AND PLAN:  1. Hyperlipidemia - continue atorvastatin 20 mg a day  Labs  Look great  Continue atorvastatin  Will recheck in 1 year    2. Mitral Valve prolapse with mild mitral regurgitation. Exam is unchanged.    Current medicines are reviewed at length with the patient today.  The patient does not have concerns regarding medicines.  The following changes have been made:  no change  Labs/ tests ordered today include:  No orders of the defined types were placed in this encounter.    Disposition:   FU with me in 1 year     Signed, Mertie Moores, MD  03/26/2016 4:34 PM    [facility] [place], [place], [place]  [address] Phone: [phone]; Fax: [phone]

## 2016-04-05 ENCOUNTER — Other Ambulatory Visit: Payer: Self-pay | Admitting: Cardiovascular Disease

## 2016-04-05 DIAGNOSIS — E785 Hyperlipidemia, unspecified: Secondary | ICD-10-CM

## 2016-04-05 DIAGNOSIS — I341 Nonrheumatic mitral (valve) prolapse: Secondary | ICD-10-CM

## 2016-04-06 NOTE — Telephone Encounter (Signed)
Medication Detail    Disp Refills Start End   atorvastatin (LIPITOR) 20 MG tablet 90 tablet 3 03/26/2016    Sig - Route: Take 1 tablet (20 mg total) by mouth daily at 6 PM. - Oral   E-Prescribing Status: Receipt confirmed by pharmacy (03/26/2016 4:52 PM EDT)   Associated Diagnoses   Mixed hyperlipidemia     MVP (mitral valve prolapse)     Pharmacy   [id number] - [place], [place]. AT [address]

## 2016-04-09 DIAGNOSIS — R52 Pain, unspecified: Secondary | ICD-10-CM | POA: Diagnosis not present

## 2016-08-18 DIAGNOSIS — H6123 Impacted cerumen, bilateral: Secondary | ICD-10-CM | POA: Diagnosis not present

## 2016-10-16 DIAGNOSIS — E785 Hyperlipidemia, unspecified: Secondary | ICD-10-CM | POA: Diagnosis not present

## 2016-10-16 DIAGNOSIS — N400 Enlarged prostate without lower urinary tract symptoms: Secondary | ICD-10-CM | POA: Diagnosis not present

## 2016-10-16 DIAGNOSIS — Z0001 Encounter for general adult medical examination with abnormal findings: Secondary | ICD-10-CM | POA: Diagnosis not present

## 2016-10-16 DIAGNOSIS — Z23 Encounter for immunization: Secondary | ICD-10-CM | POA: Diagnosis not present

## 2016-10-16 DIAGNOSIS — M25561 Pain in right knee: Secondary | ICD-10-CM | POA: Diagnosis not present

## 2016-10-16 DIAGNOSIS — F411 Generalized anxiety disorder: Secondary | ICD-10-CM | POA: Diagnosis not present

## 2016-10-22 DIAGNOSIS — M25561 Pain in right knee: Secondary | ICD-10-CM | POA: Diagnosis not present

## 2016-10-27 ENCOUNTER — Other Ambulatory Visit: Payer: Self-pay | Admitting: Orthopedic Surgery

## 2016-10-27 DIAGNOSIS — M25561 Pain in right knee: Secondary | ICD-10-CM

## 2016-11-03 ENCOUNTER — Ambulatory Visit
Admission: RE | Admit: 2016-11-03 | Discharge: 2016-11-03 | Disposition: A | Discharge status: Home / Self Care | Payer: BLUE CROSS/BLUE SHIELD | Source: Ambulatory Visit | Attending: Orthopedic Surgery | Admitting: Orthopedic Surgery

## 2016-11-03 DIAGNOSIS — M25561 Pain in right knee: Secondary | ICD-10-CM

## 2016-11-03 DIAGNOSIS — S83206A Unspecified tear of unspecified meniscus, current injury, right knee, initial encounter: Secondary | ICD-10-CM | POA: Diagnosis not present

## 2016-11-04 DIAGNOSIS — R351 Nocturia: Secondary | ICD-10-CM | POA: Diagnosis not present

## 2016-11-04 DIAGNOSIS — N5201 Erectile dysfunction due to arterial insufficiency: Secondary | ICD-10-CM | POA: Diagnosis not present

## 2016-11-04 DIAGNOSIS — N401 Enlarged prostate with lower urinary tract symptoms: Secondary | ICD-10-CM | POA: Diagnosis not present

## 2016-11-11 DIAGNOSIS — M238X1 Other internal derangements of right knee: Secondary | ICD-10-CM | POA: Diagnosis not present

## 2016-11-11 DIAGNOSIS — S83241A Other tear of medial meniscus, current injury, right knee, initial encounter: Secondary | ICD-10-CM | POA: Diagnosis not present

## 2016-11-11 DIAGNOSIS — M25561 Pain in right knee: Secondary | ICD-10-CM | POA: Diagnosis not present

## 2016-12-27 DIAGNOSIS — J019 Acute sinusitis, unspecified: Secondary | ICD-10-CM | POA: Diagnosis not present

## 2017-01-20 DIAGNOSIS — S83231A Complex tear of medial meniscus, current injury, right knee, initial encounter: Secondary | ICD-10-CM | POA: Diagnosis not present

## 2017-01-20 DIAGNOSIS — G8918 Other acute postprocedural pain: Secondary | ICD-10-CM | POA: Diagnosis not present

## 2017-01-20 DIAGNOSIS — M958 Other specified acquired deformities of musculoskeletal system: Secondary | ICD-10-CM | POA: Diagnosis not present

## 2017-01-20 DIAGNOSIS — M94261 Chondromalacia, right knee: Secondary | ICD-10-CM | POA: Diagnosis not present

## 2017-01-20 DIAGNOSIS — S83241A Other tear of medial meniscus, current injury, right knee, initial encounter: Secondary | ICD-10-CM | POA: Diagnosis not present

## 2017-02-05 DIAGNOSIS — G44229 Chronic tension-type headache, not intractable: Secondary | ICD-10-CM | POA: Diagnosis not present

## 2017-02-14 DIAGNOSIS — L259 Unspecified contact dermatitis, unspecified cause: Secondary | ICD-10-CM | POA: Diagnosis not present

## 2017-02-14 DIAGNOSIS — H10029 Other mucopurulent conjunctivitis, unspecified eye: Secondary | ICD-10-CM | POA: Diagnosis not present

## 2017-02-15 DIAGNOSIS — M25561 Pain in right knee: Secondary | ICD-10-CM | POA: Diagnosis not present

## 2017-03-03 DIAGNOSIS — M25561 Pain in right knee: Secondary | ICD-10-CM | POA: Diagnosis not present

## 2017-03-23 ENCOUNTER — Other Ambulatory Visit: Payer: Self-pay | Admitting: Cardiovascular Disease

## 2017-03-23 DIAGNOSIS — E782 Mixed hyperlipidemia: Secondary | ICD-10-CM

## 2017-03-23 DIAGNOSIS — I341 Nonrheumatic mitral (valve) prolapse: Secondary | ICD-10-CM

## 2017-03-24 DIAGNOSIS — M25561 Pain in right knee: Secondary | ICD-10-CM | POA: Diagnosis not present

## 2017-04-06 DIAGNOSIS — M25561 Pain in right knee: Secondary | ICD-10-CM | POA: Diagnosis not present

## 2017-04-06 DIAGNOSIS — G8929 Other chronic pain: Secondary | ICD-10-CM | POA: Diagnosis not present

## 2017-04-06 DIAGNOSIS — M2341 Loose body in knee, right knee: Secondary | ICD-10-CM | POA: Diagnosis not present

## 2017-04-06 DIAGNOSIS — M93261 Osteochondritis dissecans, right knee: Secondary | ICD-10-CM | POA: Diagnosis not present

## 2017-04-06 DIAGNOSIS — M25461 Effusion, right knee: Secondary | ICD-10-CM | POA: Diagnosis not present

## 2017-04-06 DIAGNOSIS — M1711 Unilateral primary osteoarthritis, right knee: Secondary | ICD-10-CM | POA: Diagnosis not present

## 2017-04-07 DIAGNOSIS — M25561 Pain in right knee: Secondary | ICD-10-CM | POA: Diagnosis not present

## 2017-04-07 DIAGNOSIS — M1711 Unilateral primary osteoarthritis, right knee: Secondary | ICD-10-CM | POA: Diagnosis not present

## 2017-04-16 ENCOUNTER — Other Ambulatory Visit: Payer: Self-pay | Admitting: Cardiovascular Disease

## 2017-04-16 DIAGNOSIS — I341 Nonrheumatic mitral (valve) prolapse: Secondary | ICD-10-CM

## 2017-04-16 DIAGNOSIS — E782 Mixed hyperlipidemia: Secondary | ICD-10-CM

## 2017-05-19 DIAGNOSIS — G8929 Other chronic pain: Secondary | ICD-10-CM | POA: Diagnosis not present

## 2017-05-19 DIAGNOSIS — M1711 Unilateral primary osteoarthritis, right knee: Secondary | ICD-10-CM | POA: Diagnosis not present

## 2017-05-19 DIAGNOSIS — M25561 Pain in right knee: Secondary | ICD-10-CM | POA: Diagnosis not present

## 2017-05-27 DIAGNOSIS — F411 Generalized anxiety disorder: Secondary | ICD-10-CM | POA: Diagnosis not present

## 2017-07-01 DIAGNOSIS — M1711 Unilateral primary osteoarthritis, right knee: Secondary | ICD-10-CM | POA: Diagnosis not present

## 2017-07-01 DIAGNOSIS — M25561 Pain in right knee: Secondary | ICD-10-CM | POA: Diagnosis not present

## 2017-08-10 DIAGNOSIS — G8929 Other chronic pain: Secondary | ICD-10-CM | POA: Diagnosis not present

## 2017-08-10 DIAGNOSIS — Z9889 Other specified postprocedural states: Secondary | ICD-10-CM | POA: Diagnosis not present

## 2017-08-10 DIAGNOSIS — M25561 Pain in right knee: Secondary | ICD-10-CM | POA: Diagnosis not present

## 2017-08-10 DIAGNOSIS — M93261 Osteochondritis dissecans, right knee: Secondary | ICD-10-CM | POA: Diagnosis not present

## 2017-11-01 DIAGNOSIS — Z0000 Encounter for general adult medical examination without abnormal findings: Secondary | ICD-10-CM | POA: Diagnosis not present

## 2017-11-01 DIAGNOSIS — Z23 Encounter for immunization: Secondary | ICD-10-CM | POA: Diagnosis not present

## 2017-11-12 DIAGNOSIS — H5213 Myopia, bilateral: Secondary | ICD-10-CM | POA: Diagnosis not present

## 2018-01-13 DIAGNOSIS — H33102 Unspecified retinoschisis, left eye: Secondary | ICD-10-CM | POA: Diagnosis not present

## 2018-01-18 DIAGNOSIS — H43811 Vitreous degeneration, right eye: Secondary | ICD-10-CM | POA: Diagnosis not present

## 2018-02-06 DIAGNOSIS — J0191 Acute recurrent sinusitis, unspecified: Secondary | ICD-10-CM | POA: Diagnosis not present

## 2018-02-13 DIAGNOSIS — J019 Acute sinusitis, unspecified: Secondary | ICD-10-CM | POA: Diagnosis not present

## 2018-02-17 DIAGNOSIS — H43811 Vitreous degeneration, right eye: Secondary | ICD-10-CM | POA: Diagnosis not present

## 2018-02-24 DIAGNOSIS — H43812 Vitreous degeneration, left eye: Secondary | ICD-10-CM | POA: Diagnosis not present

## 2018-03-08 ENCOUNTER — Ambulatory Visit (INDEPENDENT_AMBULATORY_CARE_PROVIDER_SITE_OTHER): Payer: BLUE CROSS/BLUE SHIELD | Admitting: Cardiovascular Disease

## 2018-03-08 ENCOUNTER — Encounter: Payer: Self-pay | Admitting: Cardiovascular Disease

## 2018-03-08 DIAGNOSIS — I341 Nonrheumatic mitral (valve) prolapse: Secondary | ICD-10-CM | POA: Diagnosis not present

## 2018-03-08 DIAGNOSIS — E782 Mixed hyperlipidemia: Secondary | ICD-10-CM

## 2018-03-08 DIAGNOSIS — M545 Low back pain: Secondary | ICD-10-CM | POA: Diagnosis not present

## 2018-03-08 MED ORDER — ATORVASTATIN CALCIUM 20 MG PO TABS: 20.0000 mg | ORAL_TABLET | Freq: Every day | ORAL | 3 refills | Status: DC

## 2018-03-08 NOTE — Patient Instructions (Signed)
Medication Instructions:  Your physician recommends that you continue on your current medications as directed. Please refer to the Current Medication list given to you today.  If you need a refill on your cardiac medications before your next appointment, please call your pharmacy.    Lab work: Your physician recommends that you return for lab work in: 3 months on Thursday May 28. You may come in anytime after 7:30 am.  You will need to FAST for this appointment - nothing to eat or drink after midnight the night before except water.    Testing/Procedures: None Ordered   Follow-Up: At [facility], you and your health needs are our priority.  As part of our continuing mission to provide you with exceptional heart care, we have created designated Provider Care Teams.  These Care Teams include your primary Cardiologist (physician) and Advanced Practice Providers (APPs -  Physician Assistants and Nurse Practitioners) who all work together to provide you with the care you need, when you need it. You will need a follow up appointment in:  1 years.  Please call our office 2 months in advance to schedule this appointment.  You may see Mertie Moores, MD or one of the following Advanced Practice Providers on your designated Care Team: Richardson Dopp, PA-C [place], [place] . Daune Perch, NP

## 2018-03-08 NOTE — Progress Notes (Signed)
Cardiology Office Note   Date:  03/08/2018   ID:  Garland, Hincapie Jan 13, 1956, MRN [id number]  PCP:  Lawerance Cruel, MD  Cardiologist:   Mertie Moores, MD   Chief Complaint  Patient presents with  . Hyperlipidemia   Problem List  1. hyperlipidemia 2. Mitral Valve prolapse with mild mitral regurgitation.  Previous Notes:   Kevin Reyes is a healthy 5 her gentleman with known for many years. We have seen him in the past for hyperlipidemia and mitral valve prolapse. He was last seen by [name] in 2012. He's done well since I last saw him. He continues to run on a regular basis. He's not had any episodes of chest pain or shortness of breath.  He has not had any chest pain or dyspnea.   May 02, 2013:  Kevin Reyes is doing well. He stopped his ASA due to some stomach issues. He still exercising on regular basis.  May 09, 2014 Jacarri Reyes Jaskiewicz is a 63 y.o. male who presents for MVP and HTN Having some headaches. Still running.  Lifting weights regularly.  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 Still jogging some 3-4 times a week .   Does some weights on the off days .   No CP , no dyspnea   March 08, 2018: Seen today for follow-up of his mitral prolapse and hyperlipidemia Has run out of his Atorvastatin.   Staying active.   No longer running , doing elliptical  Busy at work .    No CP , no dyspnea.     Past Medical History:  Diagnosis Date  . Bradycardia    asymptomatic  . GERD (gastroesophageal reflux disease)   . Hyperlipidemia   . Mitral regurgitation    mild per echo in 2010  . MVP (mitral valve prolapse)    Echo in 2010 with minimal prolapse of the posterior leaflet and mild MR, trace TR.     Past Surgical History:  Procedure Laterality Date  . US ECHOCARDIOGRAPHY  05/08/2008   EF 55-60%  . US ECHOCARDIOGRAPHY  08/23/2002   EF 60-65%     Current Outpatient Medications  Medication Sig Dispense  Refill  . atorvastatin (LIPITOR) 20 MG tablet Take 1 tablet (20 mg total) by mouth daily at 6 PM. 90 tablet 3  . busPIRone (BUSPAR) 30 MG tablet Take 5 mg by mouth 6 (six) times daily.    . Cetirizine HCl (ZYRTEC PO) Take 10 mg by mouth daily.     . finasteride (PROSCAR) 5 MG tablet Take 5 mg by mouth daily.  3  . gabapentin (NEURONTIN) 300 MG capsule Take 1 capsule by mouth at bedtime.     No current facility-administered medications for this visit.     Allergies:   Patient has no known allergies.    Social History:  The patient  reports that he has never smoked. He has never used smokeless tobacco. He reports current alcohol use. He reports that he does not use drugs.   Family History:  The patient's family history includes Hypertension in his mother.    ROS:  Please see the history of present illness.      Physical Exam: Blood pressure 124/80, pulse (!) 48, height 5\' 11"  (1.803 m), weight 186 lb 12.8 oz (84.7 kg), SpO2 96 %.  GEN:  Well nourished, well developed in no acute distress HEENT: Normal NECK: No  JVD; No carotid bruits LYMPHATICS: No lymphadenopathy CARDIAC: RRR,  Soft systolic murmur .  RESPIRATORY:  Clear to auscultation without rales, wheezing or rhonchi  ABDOMEN: Soft, non-tender, non-distended MUSCULOSKELETAL:  No edema; No deformity  SKIN: Warm and dry NEUROLOGIC:  Alert and oriented x 3   EKG:   March 08, 2018: Sinus bradycardia 48 beats minute.  Nonspecific T wave abnormality.  Recent Labs: No results found for requested labs within last 8760 hours.    Lipid Panel    Component Value Date/Time   CHOL 140 03/25/2016 0732   TRIG 114 03/25/2016 0732   HDL 45 03/25/2016 0732   CHOLHDL 3.1 03/25/2016 0732   CHOLHDL 3 05/09/2014 0758   VLDL 18.8 05/09/2014 0758   LDLCALC 72 03/25/2016 0732      Wt Readings from Last 3 Encounters:  03/08/18 186 lb 12.8 oz (84.7 kg)  03/26/16 183 lb (83 kg)  05/09/14 184 lb 12.8 oz (83.8 kg)      Other  studies Reviewed: Additional studies/ records that were reviewed today include: . Review of the above records demonstrates:    ASSESSMENT AND PLAN:  1. Hyperlipidemia -  He is off his atorvastatin for several months .  Will restart and check lipids in 3 months   2. Mitral Valve prolapse with mild mitral regurgitation.   Exam is unchanged.  Last echo was 10 years ago  I think there is a low likelyhood of him ever needing MV surgery  Will see him in 1 year    Current medicines are reviewed at length with the patient today.  The patient does not have concerns regarding medicines.  The following changes have been made:  no change  Labs/ tests ordered today include:   Orders Placed This Encounter  Procedures  . Lipid Profile  . Basic Metabolic Panel (BMET)  . Hepatic function panel  . EKG 12-Lead     Disposition:   FU with me in 1 year     Signed, Mertie Moores, MD  03/08/2018 5:05 PM    [facility] [place], [place], [place]  [address] Phone: [phone]; Fax: [phone]

## 2018-03-22 DIAGNOSIS — M545 Low back pain: Secondary | ICD-10-CM | POA: Diagnosis not present

## 2018-05-12 DIAGNOSIS — G44229 Chronic tension-type headache, not intractable: Secondary | ICD-10-CM | POA: Diagnosis not present

## 2018-05-19 DIAGNOSIS — F411 Generalized anxiety disorder: Secondary | ICD-10-CM | POA: Diagnosis not present

## 2018-05-26 DIAGNOSIS — F411 Generalized anxiety disorder: Secondary | ICD-10-CM | POA: Diagnosis not present

## 2018-05-26 DIAGNOSIS — R1013 Epigastric pain: Secondary | ICD-10-CM | POA: Diagnosis not present

## 2018-06-09 ENCOUNTER — Other Ambulatory Visit: Payer: BLUE CROSS/BLUE SHIELD

## 2018-06-09 DIAGNOSIS — R1013 Epigastric pain: Secondary | ICD-10-CM | POA: Diagnosis not present

## 2018-06-09 DIAGNOSIS — F411 Generalized anxiety disorder: Secondary | ICD-10-CM | POA: Diagnosis not present

## 2018-06-16 ENCOUNTER — Other Ambulatory Visit: Payer: BLUE CROSS/BLUE SHIELD

## 2018-06-17 DIAGNOSIS — K219 Gastro-esophageal reflux disease without esophagitis: Secondary | ICD-10-CM | POA: Diagnosis not present

## 2018-06-27 DIAGNOSIS — R351 Nocturia: Secondary | ICD-10-CM | POA: Diagnosis not present

## 2018-06-27 DIAGNOSIS — N401 Enlarged prostate with lower urinary tract symptoms: Secondary | ICD-10-CM | POA: Diagnosis not present

## 2018-06-27 DIAGNOSIS — N5201 Erectile dysfunction due to arterial insufficiency: Secondary | ICD-10-CM | POA: Diagnosis not present

## 2018-07-06 DIAGNOSIS — F411 Generalized anxiety disorder: Secondary | ICD-10-CM | POA: Diagnosis not present

## 2018-07-14 DIAGNOSIS — H43813 Vitreous degeneration, bilateral: Secondary | ICD-10-CM | POA: Diagnosis not present

## 2018-07-22 DIAGNOSIS — Z1211 Encounter for screening for malignant neoplasm of colon: Secondary | ICD-10-CM | POA: Diagnosis not present

## 2018-07-22 DIAGNOSIS — K5730 Diverticulosis of large intestine without perforation or abscess without bleeding: Secondary | ICD-10-CM | POA: Diagnosis not present

## 2018-07-22 DIAGNOSIS — D123 Benign neoplasm of transverse colon: Secondary | ICD-10-CM | POA: Diagnosis not present

## 2018-11-26 IMAGING — MR MR KNEE*R* W/O CM
5 series · 31 of 40 positions shown · non-contrast
Comparison: None.

CLINICAL DATA: Right anterior and medial knee pain.

EXAM:
MRI OF THE RIGHT KNEE WITHOUT CONTRAST
TECHNIQUE: Multiplanar, multisequence MR imaging of the knee was performed. No
intravenous contrast was administered.

[Series 6: PD fat-sat · axial · right · 3.0mm · 0.39mm/px · z∈[-11,+110]mm · 8 of 38 slices shown (1 of 3)]
[im 1/38]
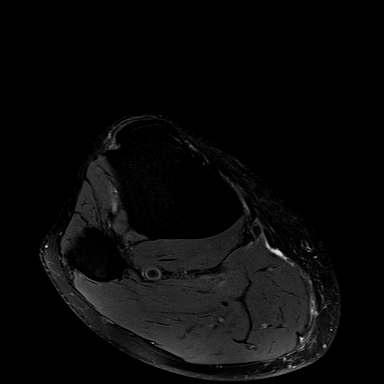
[im 5/38]
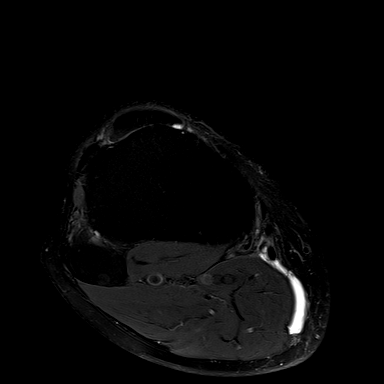
[im 13/38]
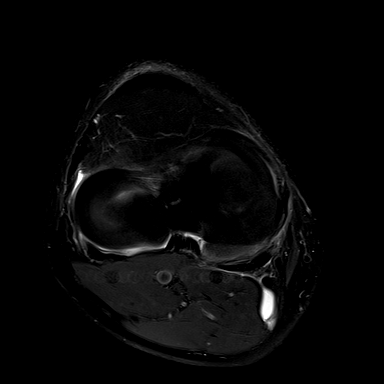
[im 17/38]
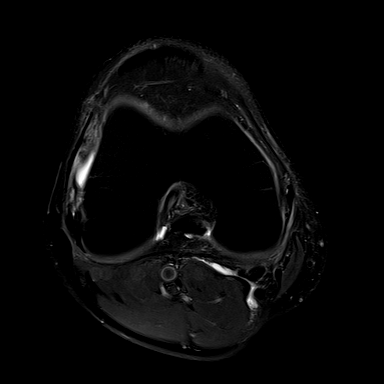
[im 21/38]
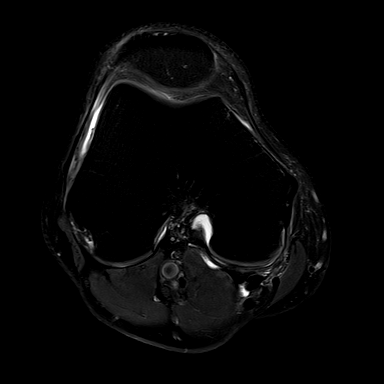
[im 25/38]
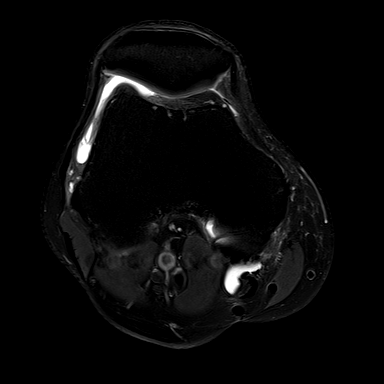
[im 33/38]
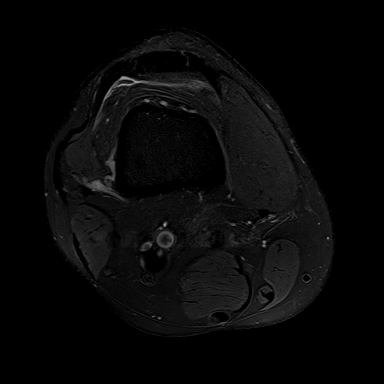
[im 38/38]
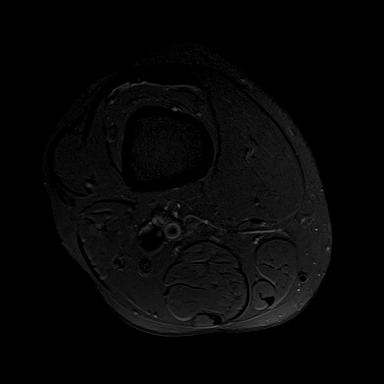

[Series 7: T1 · coronal · right · 3.0mm · 0.33mm/px · 1 of 35 slices shown]
[im 1/35]
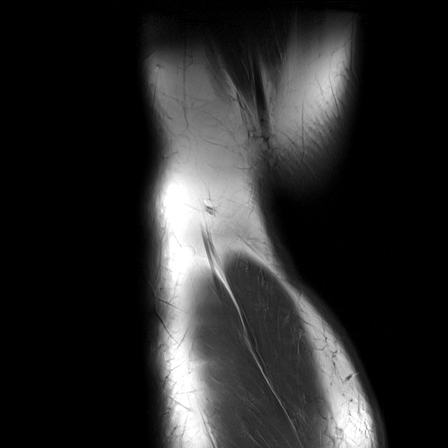

[Series 8: PD fat-sat · coronal · right · 3.0mm · 0.33mm/px · 8 of 35 slices shown (2 of 3)]
[im 1/35]
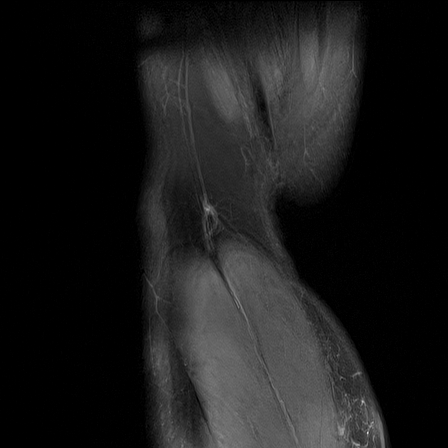
[im 5/35]
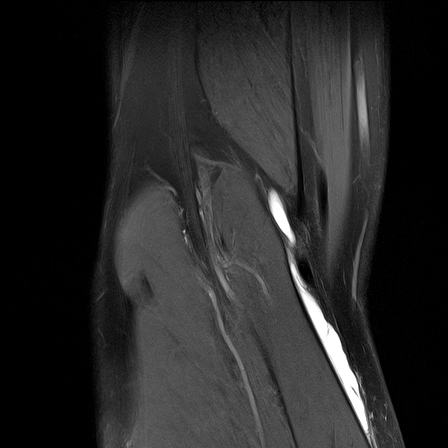
[im 10/35]
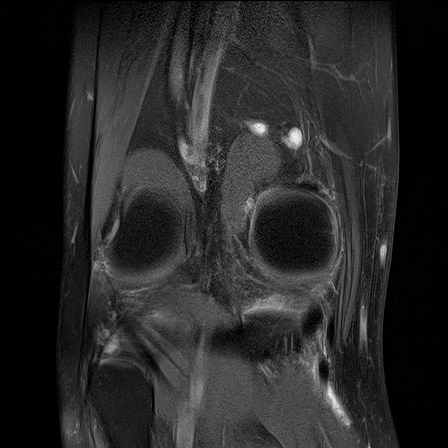
[im 15/35]
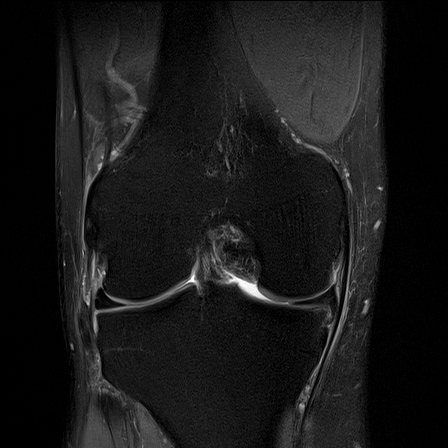
[im 20/35]
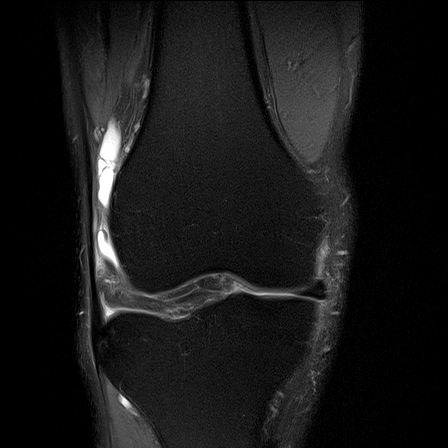
[im 25/35]
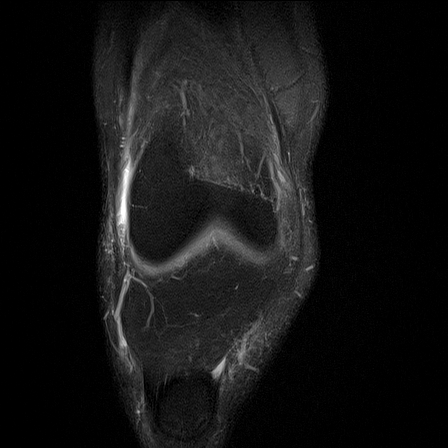
[im 30/35]
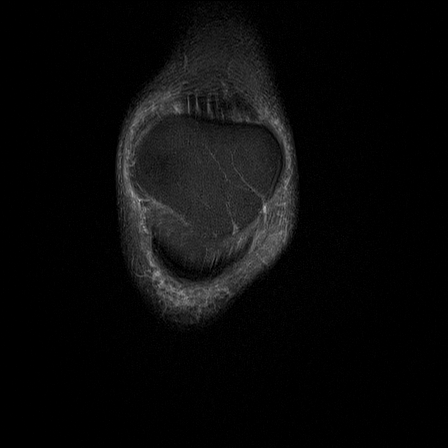
[im 35/35]
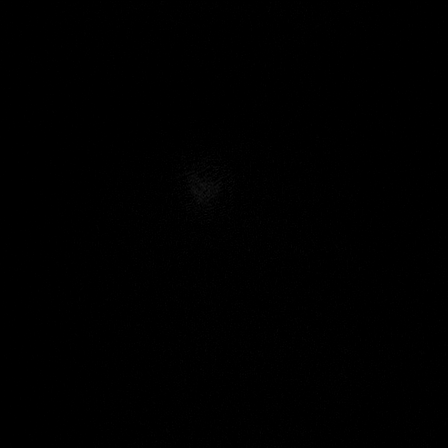

[Series 9: PD fat-sat · sagittal · right · 3.0mm · 0.39mm/px · 6 of 27 slices shown (3 of 3)]
[im 1/27]
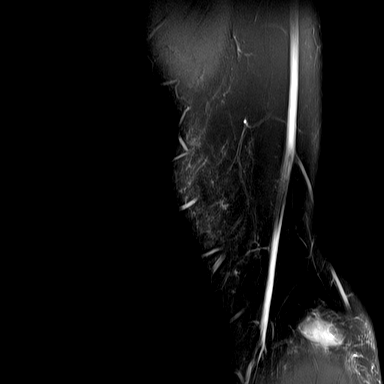
[im 6/27]
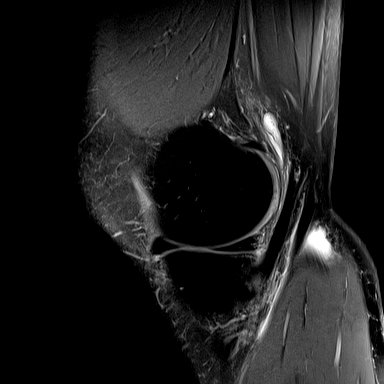
[im 11/27]
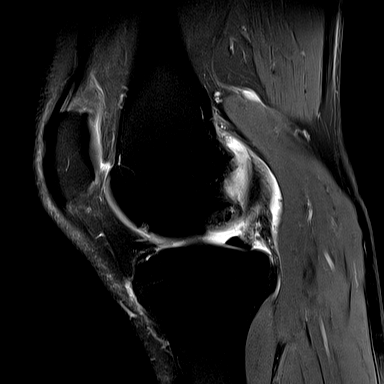
[im 16/27]
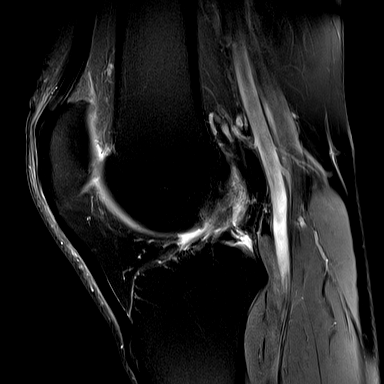
[im 21/27]
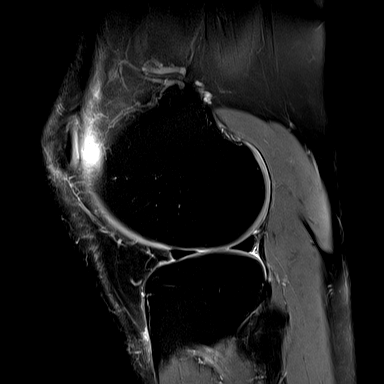
[im 27/27]
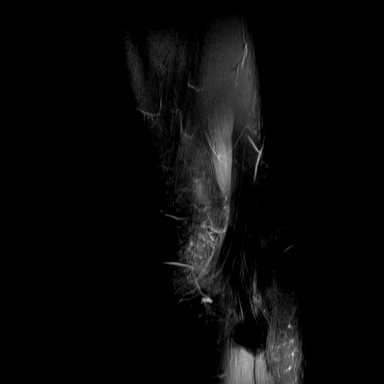

[Series 10: T2 fat-sat · coronal · right · 3.0mm · 0.39mm/px · 8 of 35 slices shown]
[im 1/35]
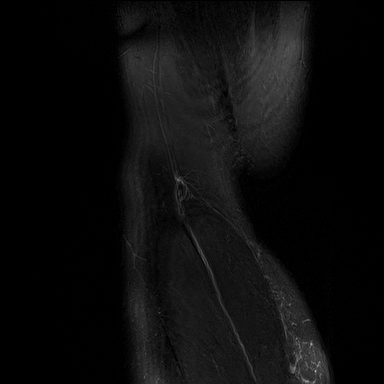
[im 5/35]
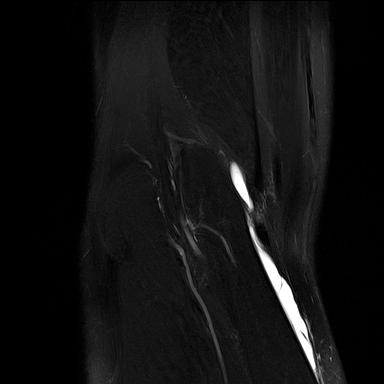
[im 10/35]
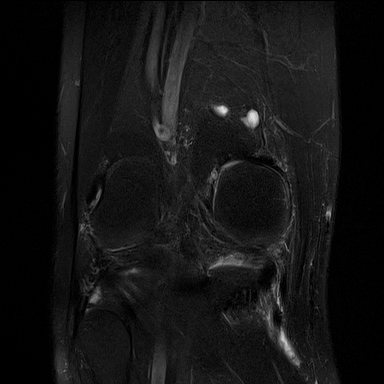
[im 15/35]
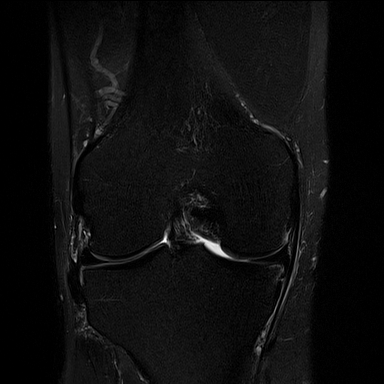
[im 20/35]
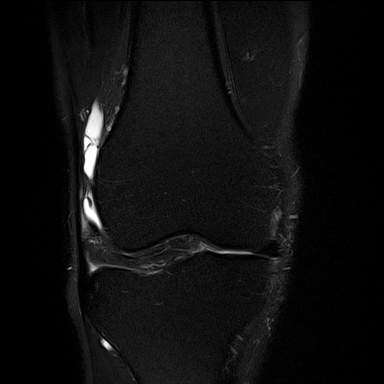
[im 25/35]
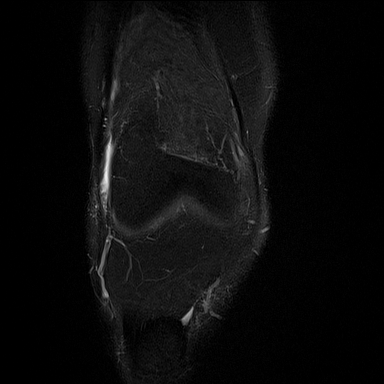
[im 30/35]
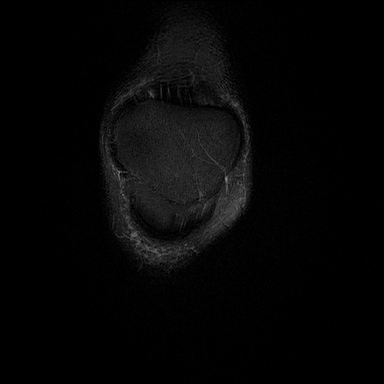
[im 35/35]
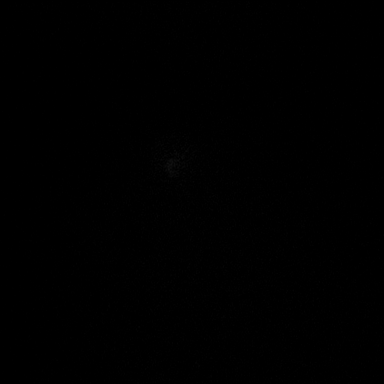

[31 of 40 positions shown; findings below may reference images not displayed]

FINDINGS: MENISCI

Medial meniscus: Small undersurface tear of the posterior horn of
the medial meniscus. Small flap of meniscal tissue along the
periphery of the body of the medial meniscus flipped into the
inferior gutter.

Lateral meniscus:  Intact.

LIGAMENTS

Cruciates:  Intact ACL and PCL.

Collaterals: Medial collateral ligament is intact. Lateral
collateral ligament complex is intact.

CARTILAGE

Patellofemoral: Small focal area of full-thickness cartilage loss of
the medial trochlea with minimal subchondral reactive marrow
changes.

Medial:  No chondral defect.

Lateral:  No chondral defect.

Joint:  No joint effusion. Normal Hoffa's fat. No plical thickening.

Popliteal Fossa:  Small Baker's cyst.  Intact popliteus tendon.

Extensor Mechanism: Intact quadriceps tendon and patellar tendon.
Intact medial and lateral patellar retinaculum. Intact MPFL.

Bones: No focal marrow signal abnormality. No fracture or
dislocation.

Other: No fluid collection or hematoma.
IMPRESSION: 1. Small undersurface tear of the posterior horn of the medial
meniscus. Small flap of meniscal tissue along the periphery of the
body of the medial meniscus flipped into the inferior gutter.
2. Small focal area of full-thickness cartilage loss of the medial
trochlea with minimal subchondral reactive marrow changes.

## 2018-12-03 DIAGNOSIS — Z20828 Contact with and (suspected) exposure to other viral communicable diseases: Secondary | ICD-10-CM | POA: Diagnosis not present

## 2018-12-26 DIAGNOSIS — M545 Low back pain: Secondary | ICD-10-CM | POA: Diagnosis not present

## 2018-12-26 DIAGNOSIS — M25551 Pain in right hip: Secondary | ICD-10-CM | POA: Diagnosis not present

## 2018-12-26 DIAGNOSIS — M76891 Other specified enthesopathies of right lower limb, excluding foot: Secondary | ICD-10-CM | POA: Diagnosis not present

## 2018-12-27 DIAGNOSIS — B029 Zoster without complications: Secondary | ICD-10-CM | POA: Diagnosis not present

## 2019-02-01 ENCOUNTER — Other Ambulatory Visit: Payer: Self-pay | Admitting: Cardiovascular Disease

## 2019-02-01 DIAGNOSIS — I341 Nonrheumatic mitral (valve) prolapse: Secondary | ICD-10-CM

## 2019-02-01 DIAGNOSIS — E782 Mixed hyperlipidemia: Secondary | ICD-10-CM

## 2019-03-06 ENCOUNTER — Encounter: Payer: Self-pay | Admitting: Cardiovascular Disease

## 2019-03-06 DIAGNOSIS — Z1159 Encounter for screening for other viral diseases: Secondary | ICD-10-CM | POA: Diagnosis not present

## 2019-03-06 DIAGNOSIS — Z5181 Encounter for therapeutic drug level monitoring: Secondary | ICD-10-CM | POA: Diagnosis not present

## 2019-03-06 DIAGNOSIS — Z0000 Encounter for general adult medical examination without abnormal findings: Secondary | ICD-10-CM | POA: Diagnosis not present

## 2019-03-06 DIAGNOSIS — E785 Hyperlipidemia, unspecified: Secondary | ICD-10-CM | POA: Diagnosis not present

## 2019-03-06 DIAGNOSIS — K219 Gastro-esophageal reflux disease without esophagitis: Secondary | ICD-10-CM | POA: Diagnosis not present

## 2019-03-06 DIAGNOSIS — Z125 Encounter for screening for malignant neoplasm of prostate: Secondary | ICD-10-CM | POA: Diagnosis not present

## 2019-04-28 ENCOUNTER — Encounter: Payer: Self-pay | Admitting: Cardiovascular Disease

## 2019-04-28 ENCOUNTER — Ambulatory Visit (INDEPENDENT_AMBULATORY_CARE_PROVIDER_SITE_OTHER): Payer: BC Managed Care – PPO | Admitting: Cardiovascular Disease

## 2019-04-28 ENCOUNTER — Other Ambulatory Visit: Payer: Self-pay

## 2019-04-28 VITALS — BP 116/68 | HR 44 | Ht 71.0 in | Wt 192.2 lb

## 2019-04-28 DIAGNOSIS — I341 Nonrheumatic mitral (valve) prolapse: Secondary | ICD-10-CM

## 2019-04-28 DIAGNOSIS — E782 Mixed hyperlipidemia: Secondary | ICD-10-CM

## 2019-04-28 NOTE — Progress Notes (Signed)
Cardiology Office Note   Date:  04/28/2019   ID:  Kevin Reyes, DOB Oct 19, 1955, MRN [id number]  PCP:  Kathyrn Lass, MD  Cardiologist:   Mertie Moores, MD   No chief complaint on file.  Problem List  1. hyperlipidemia 2. Mitral Valve prolapse with mild mitral regurgitation.  Previous Notes:   Kevin Reyes is a healthy 25 her gentleman with known for many years. We have seen him in the past for hyperlipidemia and mitral valve prolapse. He was last seen by [name] in 2012. He's done well since I last saw him. He continues to run on a regular basis. He's not had any episodes of chest pain or shortness of breath.  He has not had any chest pain or dyspnea.   May 02, 2013:  Kevin Reyes is doing well. He stopped his ASA due to some stomach issues. He still exercising on regular basis.  May 09, 2014 Kevin Reyes is a 64 y.o. male who presents for MVP and HTN Having some headaches. Still running.  Lifting weights regularly.  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 Still jogging some 3-4 times a week .   Does some weights on the off days .   No CP , no dyspnea   March 08, 2018: Seen today for follow-up of his mitral prolapse and hyperlipidemia Has run out of his Atorvastatin.   Staying active.   No longer running , doing elliptical  Busy at work .    No CP , no dyspnea.     April 28, 2019: Kevin Reyes is seen today for follow-up of his hyperlipidemia and mitral valve prolapse. Has had both vaccines  Exercising regularly  Feels well  Does not run any longer - knee issues .   Does the elliptical  No CP or dyspnea    Past Medical History:  Diagnosis Date  . Bradycardia    asymptomatic  . GERD (gastroesophageal reflux disease)   . Hyperlipidemia   . Mitral regurgitation    mild per echo in 2010  . MVP (mitral valve prolapse)    Echo in 2010 with minimal prolapse of the posterior leaflet and mild MR, trace TR.     Past  Surgical History:  Procedure Laterality Date  . US ECHOCARDIOGRAPHY  05/08/2008   EF 55-60%  . US ECHOCARDIOGRAPHY  08/23/2002   EF 60-65%     Current Outpatient Medications  Medication Sig Dispense Refill  . atorvastatin (LIPITOR) 20 MG tablet TAKE 1 TABLET (20 MG TOTAL) BY MOUTH DAILY AT 6 PM. 90 tablet 3  . busPIRone (BUSPAR) 30 MG tablet Take 5 mg by mouth 6 (six) times daily.    . Cetirizine HCl (ZYRTEC PO) Take 10 mg by mouth daily.     . finasteride (PROSCAR) 5 MG tablet Take 5 mg by mouth daily.  3  . gabapentin (NEURONTIN) 300 MG capsule Take 2 capsules by mouth daily.     No current facility-administered medications for this visit.    Allergies:   Patient has no known allergies.    Social History:  The patient  reports that he has never smoked. He has never used smokeless tobacco. He reports current alcohol use. He reports that he does not use drugs.   Family History:  The patient's family history includes Hypertension in his mother.    ROS:  Please see the history of present illness.  Physical Exam: Blood pressure 116/68, pulse (!) 44, height 5\' 11"  (1.803 m), weight 192 lb 4 oz (87.2 kg), SpO2 96 %.  GEN:  Well nourished, well developed in no acute distress HEENT: Normal NECK: No JVD; No carotid bruits LYMPHATICS: No lymphadenopathy CARDIAC: RRR,  Very soft late peaking mid systolic murmur.   RESPIRATORY:  Clear to auscultation without rales, wheezing or rhonchi  ABDOMEN: Soft, non-tender, non-distended MUSCULOSKELETAL:  No edema; No deformity  SKIN: Warm and dry NEUROLOGIC:  Alert and oriented x 3    EKG:   April 28, 2019: Sinus bradycardia 44 beats a minute.  Nonspecific T wave inversions.  There are no changes from his previous EKG.  Recent Labs: No results found for requested labs within last 8760 hours.    Lipid Panel    Component Value Date/Time   CHOL 140 03/25/2016 0732   TRIG 114 03/25/2016 0732   HDL 45 03/25/2016 0732   CHOLHDL 3.1  03/25/2016 0732   CHOLHDL 3 05/09/2014 0758   VLDL 18.8 05/09/2014 0758   LDLCALC 72 03/25/2016 0732      Wt Readings from Last 3 Encounters:  04/28/19 192 lb 4 oz (87.2 kg)  03/08/18 186 lb 12.8 oz (84.7 kg)  03/26/16 183 lb (83 kg)      Other studies Reviewed: Additional studies/ records that were reviewed today include: . Review of the above records demonstrates:    ASSESSMENT AND PLAN:  1. Hyperlipidemia -recent labs are drawn at his primary medical doctor's office.  The KP and does not have his LDL but his other numbers look good.  We will call Dr. Ammie Ferrier office and have the labs faxed over.  Continue current dose of atorvastatin.   2. Mitral Valve prolapse with mild mitral regurgitation.  Mitral valve sounds unchanged on exam.  His last echo was 11 years ago.  Will consider getting repeat echo next several years although his clinical status and his exam has not changed in years.    Current medicines are reviewed at length with the patient today.  The patient does not have concerns regarding medicines.  The following changes have been made:  no change  Labs/ tests ordered today include:   No orders of the defined types were placed in this encounter.    Disposition:   FU with me in 1 year     Signed, Mertie Moores, MD  04/28/2019 8:39 AM    [facility] [place], [place], [place]  [address] Phone: [phone]; Fax: [phone]

## 2019-04-28 NOTE — Patient Instructions (Signed)
Medication Instructions:  Your physician recommends that you continue on your current medications as directed. Please refer to the Current Medication list given to you today.  *If you need a refill on your cardiac medications before your next appointment, please call your pharmacy*   Lab Work: None ordered If you have labs (blood work) drawn today and your tests are completely normal, you will receive your results only by: Marland Kitchen MyChart Message (if you have MyChart) OR . A paper copy in the mail If you have any lab test that is abnormal or we need to change your treatment, we will call you to review the results.   Testing/Procedures: None ordered   Follow-Up: At [facility], you and your health needs are our priority.  As part of our continuing mission to provide you with exceptional heart care, we have created designated Provider Care Teams.  These Care Teams include your primary Cardiologist (physician) and Advanced Practice Providers (APPs -  Physician Assistants and Nurse Practitioners) who all work together to provide you with the care you need, when you need it.  We recommend signing up for the patient portal called "MyChart".  Sign up information is provided on this After Visit Summary.  MyChart is used to connect with patients for Virtual Visits (Telemedicine).  Patients are able to view lab/test results, encounter notes, upcoming appointments, etc.  Non-urgent messages can be sent to your provider as well.   To learn more about what you can do with MyChart, go to [email or web address].    Your next appointment:   1 year(s)  The format for your next appointment:   In Person  Provider:   Mertie Moores, MD   Thank you for choosing [facility]!!     Other Instructions

## 2019-07-18 DIAGNOSIS — H43813 Vitreous degeneration, bilateral: Secondary | ICD-10-CM | POA: Diagnosis not present

## 2019-08-07 DIAGNOSIS — F411 Generalized anxiety disorder: Secondary | ICD-10-CM | POA: Diagnosis not present

## 2019-09-12 DIAGNOSIS — H1031 Unspecified acute conjunctivitis, right eye: Secondary | ICD-10-CM | POA: Diagnosis not present

## 2019-10-11 DIAGNOSIS — F4322 Adjustment disorder with anxiety: Secondary | ICD-10-CM | POA: Diagnosis not present

## 2019-10-18 DIAGNOSIS — F4322 Adjustment disorder with anxiety: Secondary | ICD-10-CM | POA: Diagnosis not present

## 2019-10-25 DIAGNOSIS — F4322 Adjustment disorder with anxiety: Secondary | ICD-10-CM | POA: Diagnosis not present

## 2019-11-16 DIAGNOSIS — F4322 Adjustment disorder with anxiety: Secondary | ICD-10-CM | POA: Diagnosis not present

## 2019-11-23 DIAGNOSIS — F4322 Adjustment disorder with anxiety: Secondary | ICD-10-CM | POA: Diagnosis not present

## 2019-12-15 DIAGNOSIS — F4322 Adjustment disorder with anxiety: Secondary | ICD-10-CM | POA: Diagnosis not present

## 2019-12-28 DIAGNOSIS — F4322 Adjustment disorder with anxiety: Secondary | ICD-10-CM | POA: Diagnosis not present

## 2020-01-18 DIAGNOSIS — F4322 Adjustment disorder with anxiety: Secondary | ICD-10-CM | POA: Diagnosis not present

## 2020-01-31 ENCOUNTER — Other Ambulatory Visit: Payer: Self-pay | Admitting: Cardiovascular Disease

## 2020-01-31 DIAGNOSIS — E782 Mixed hyperlipidemia: Secondary | ICD-10-CM

## 2020-01-31 DIAGNOSIS — I341 Nonrheumatic mitral (valve) prolapse: Secondary | ICD-10-CM

## 2020-02-06 DIAGNOSIS — F411 Generalized anxiety disorder: Secondary | ICD-10-CM | POA: Diagnosis not present

## 2020-02-08 DIAGNOSIS — F4322 Adjustment disorder with anxiety: Secondary | ICD-10-CM | POA: Diagnosis not present

## 2020-02-29 DIAGNOSIS — F4322 Adjustment disorder with anxiety: Secondary | ICD-10-CM | POA: Diagnosis not present

## 2020-03-11 DIAGNOSIS — K635 Polyp of colon: Secondary | ICD-10-CM | POA: Diagnosis not present

## 2020-03-11 DIAGNOSIS — F411 Generalized anxiety disorder: Secondary | ICD-10-CM | POA: Diagnosis not present

## 2020-03-11 DIAGNOSIS — Z0000 Encounter for general adult medical examination without abnormal findings: Secondary | ICD-10-CM | POA: Diagnosis not present

## 2020-03-11 DIAGNOSIS — G44229 Chronic tension-type headache, not intractable: Secondary | ICD-10-CM | POA: Diagnosis not present

## 2020-03-11 DIAGNOSIS — E785 Hyperlipidemia, unspecified: Secondary | ICD-10-CM | POA: Diagnosis not present

## 2020-03-11 DIAGNOSIS — Z5181 Encounter for therapeutic drug level monitoring: Secondary | ICD-10-CM | POA: Diagnosis not present

## 2020-03-28 DIAGNOSIS — F4322 Adjustment disorder with anxiety: Secondary | ICD-10-CM | POA: Diagnosis not present

## 2020-04-16 ENCOUNTER — Other Ambulatory Visit: Payer: Self-pay | Admitting: Cardiovascular Disease

## 2020-04-16 DIAGNOSIS — I341 Nonrheumatic mitral (valve) prolapse: Secondary | ICD-10-CM

## 2020-04-16 DIAGNOSIS — E782 Mixed hyperlipidemia: Secondary | ICD-10-CM

## 2020-04-18 DIAGNOSIS — F4322 Adjustment disorder with anxiety: Secondary | ICD-10-CM | POA: Diagnosis not present

## 2020-06-09 ENCOUNTER — Other Ambulatory Visit: Payer: Self-pay | Admitting: Cardiovascular Disease

## 2020-06-09 DIAGNOSIS — E782 Mixed hyperlipidemia: Secondary | ICD-10-CM

## 2020-06-09 DIAGNOSIS — I341 Nonrheumatic mitral (valve) prolapse: Secondary | ICD-10-CM

## 2020-06-19 DIAGNOSIS — R351 Nocturia: Secondary | ICD-10-CM | POA: Diagnosis not present

## 2020-06-19 DIAGNOSIS — N401 Enlarged prostate with lower urinary tract symptoms: Secondary | ICD-10-CM | POA: Diagnosis not present

## 2020-06-19 DIAGNOSIS — N5201 Erectile dysfunction due to arterial insufficiency: Secondary | ICD-10-CM | POA: Diagnosis not present

## 2020-07-06 ENCOUNTER — Other Ambulatory Visit: Payer: Self-pay | Admitting: Cardiovascular Disease

## 2020-07-06 DIAGNOSIS — E782 Mixed hyperlipidemia: Secondary | ICD-10-CM

## 2020-07-06 DIAGNOSIS — I341 Nonrheumatic mitral (valve) prolapse: Secondary | ICD-10-CM

## 2020-07-10 DIAGNOSIS — F411 Generalized anxiety disorder: Secondary | ICD-10-CM | POA: Diagnosis not present

## 2020-07-19 ENCOUNTER — Other Ambulatory Visit: Payer: Self-pay | Admitting: Cardiovascular Disease

## 2020-07-19 DIAGNOSIS — I341 Nonrheumatic mitral (valve) prolapse: Secondary | ICD-10-CM

## 2020-07-19 DIAGNOSIS — E782 Mixed hyperlipidemia: Secondary | ICD-10-CM

## 2020-07-25 DIAGNOSIS — H2513 Age-related nuclear cataract, bilateral: Secondary | ICD-10-CM | POA: Diagnosis not present

## 2020-07-25 DIAGNOSIS — H1031 Unspecified acute conjunctivitis, right eye: Secondary | ICD-10-CM | POA: Diagnosis not present

## 2020-07-25 DIAGNOSIS — H524 Presbyopia: Secondary | ICD-10-CM | POA: Diagnosis not present

## 2020-08-21 DIAGNOSIS — H6122 Impacted cerumen, left ear: Secondary | ICD-10-CM | POA: Diagnosis not present

## 2020-10-03 ENCOUNTER — Other Ambulatory Visit: Payer: Self-pay

## 2020-10-03 ENCOUNTER — Ambulatory Visit: Payer: BC Managed Care – PPO | Attending: Family Medicine | Admitting: Audiology

## 2020-10-03 DIAGNOSIS — H903 Sensorineural hearing loss, bilateral: Secondary | ICD-10-CM | POA: Insufficient documentation

## 2020-10-04 NOTE — Procedures (Signed)
  Outpatient Audiology and [facility], [place]  [address] [phone]  AUDIOLOGICAL  EVALUATION  NAME: Kevin Reyes     DOB:   [birth year]      MRN: [id number]                                                                                     DATE: 10/04/2020     REFERENT: Kathyrn Lass, MD STATUS: Outpatient DIAGNOSIS: Sensorineural hearing loss, bilateral   History: [name] was seen for an audiological evaluation due to decreased hearing occurring over the past year. He subjectively reports he hears better from the right ear. He recently had cerumen removed at his PCP's office and reports his hearing improved after the cerumen was taken out. [name] denies tinnitus, vertigo. He reports otalgia during flights from pressure changes. He reports some aural fullness in both ears. [name] reports increased communication difficulty communicating with friends and family.   Evaluation:  Otoscopy showed a clear view of the tympanic membranes, bilaterally Tympanometry results were consistent with normal middle ear pressure and normal tympanic membrane mobility, bilaterally.  Audiometric testing was completed using Conventional Audiometry techniques with insert earphones and TDH headphones. Test results are consistent in the right ear with normal hearing sensitivity sloping to a mild sensorineural hearing loss and in the left ear with normal hearing sensitivity sloping to a moderate sensorineural hearing loss. Sensorineural asymmetry noted at 4000-8000 Hz, worse in the left ear. Speech Recognition Thresholds were obtained at 10 dB HL in the right ear and at 15  dB HL in the left ear. Word Recognition Testing was completed at 70 dB HL and [name] scored 96% in the right ear and 100% in the left ear.    Results:  Today's results are consistent in the right ear with normal hearing sensitivity sloping to a mild sensorineural hearing loss and in the left ear with normal  hearing sensitivity sloping to a moderate sensorineural hearing loss. Sensorineural asymmetry noted at 4000-8000 Hz, worse in the left ear. [name] may have communication difficulty in adverse listening situations. He will benefit from the use of good communication strategies. [name] was given handouts on effective communication strategies. The test results  and recommendations were reviewed with [name].  Recommendations: Referral to an Ear, Nose, and Throat Physician due to sensorineural asymmetric hearing loss.  Use of good communication strategies. Monitor hearing in 2-3 years.    If you have any questions please feel free to contact me at [phone].  Bari Mantis Audiologist, Au.D., CCC-A 10/04/2020  10:55 AM  Cc: Kathyrn Lass, MD

## 2021-02-13 ENCOUNTER — Other Ambulatory Visit: Payer: Self-pay

## 2021-02-13 ENCOUNTER — Encounter: Payer: Self-pay | Admitting: Cardiovascular Disease

## 2021-02-13 ENCOUNTER — Ambulatory Visit (INDEPENDENT_AMBULATORY_CARE_PROVIDER_SITE_OTHER): Payer: BC Managed Care – PPO | Admitting: Cardiovascular Disease

## 2021-02-13 VITALS — BP 130/76 | HR 54 | Ht 71.0 in | Wt 193.4 lb

## 2021-02-13 DIAGNOSIS — E782 Mixed hyperlipidemia: Secondary | ICD-10-CM

## 2021-02-13 DIAGNOSIS — I341 Nonrheumatic mitral (valve) prolapse: Secondary | ICD-10-CM

## 2021-02-13 NOTE — Progress Notes (Signed)
Cardiology Office Note   Date:  02/13/2021   ID:  Kevin Reyes, Kevin Reyes 1955-02-08, MRN [id number]  PCP:  Kathyrn Lass, MD  Cardiologist:   Mertie Moores, MD   Chief Complaint  Patient presents with   Mitral Valve Prolapse   Hyperlipidemia   Problem List  1. hyperlipidemia 2. Mitral Valve prolapse with mild mitral regurgitation.  Previous Notes:   Kevin Reyes is a healthy 39 her gentleman with known for many years. We have seen him in the past for hyperlipidemia and mitral valve prolapse. He was last seen by [name] in 2012. He's done well since I last saw him. He continues to run on a regular basis. He's not had any episodes of chest pain or shortness of breath.  He has not had any chest pain or dyspnea.     May 02, 2013:  Kevin Reyes is doing well.  He stopped his ASA due to some stomach issues.   He still exercising on regular basis.  May 09, 2014 Kevin Reyes is a 66 y.o. male who presents for MVP and HTN Having some headaches. Still running.  Lifting weights regularly.  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 Still jogging some 3-4 times a week .   Does some weights on the off days .   No CP , no dyspnea   March 08, 2018: Seen today for follow-up of his mitral prolapse and hyperlipidemia Has run out of his Atorvastatin.   Staying active.   No longer running , doing elliptical  Busy at work .    No CP , no dyspnea.     April 28, 2019: Kevin Reyes is seen today for follow-up of his hyperlipidemia and mitral valve prolapse. Has had both vaccines  Exercising regularly  Feels well  Does not run any longer - knee issues .   Does the elliptical  No CP or dyspnea   February 13, 2021: Kevin Reyes is seen today.  He is overall doing well.  He is not jogging any longer because of knee issues.  His EKG is unchanged.  Past Medical History:  Diagnosis Date   Bradycardia    asymptomatic   GERD (gastroesophageal reflux disease)     Hyperlipidemia    Mitral regurgitation    mild per echo in 2010   MVP (mitral valve prolapse)    Echo in 2010 with minimal prolapse of the posterior leaflet and mild MR, trace TR.     Past Surgical History:  Procedure Laterality Date   US ECHOCARDIOGRAPHY  05/08/2008   EF 55-60%   US ECHOCARDIOGRAPHY  08/23/2002   EF 60-65%     Current Outpatient Medications  Medication Sig Dispense Refill   atorvastatin (LIPITOR) 20 MG tablet TAKE 1 TABLET BY MOUTH DAILY AT 6 PM. PLEASE MAKE OVERDUE APPT BEFORE ANYMORE REFILLS 15 tablet 0   busPIRone (BUSPAR) 30 MG tablet Take 10 mg by mouth 3 (three) times daily.     Cetirizine HCl (ZYRTEC PO) Take 10 mg by mouth daily.      famotidine (PEPCID) 10 MG tablet 1 tablet as needed     finasteride (PROSCAR) 5 MG tablet Take 5 mg by mouth daily.  3   fluticasone (FLONASE) 50 MCG/ACT nasal spray 1 spray in each nostril     gabapentin (NEURONTIN) 300 MG capsule Take 2 capsules by mouth daily.     sildenafil (VIAGRA) 100 MG  tablet 1 tablet as needed     No current facility-administered medications for this visit.    Allergies:   Patient has no known allergies.    Social History:  The patient  reports that he has never smoked. He has never used smokeless tobacco. He reports current alcohol use. He reports that he does not use drugs.   Family History:  The patient's family history includes Hypertension in his mother.    ROS:  Please see the history of present illness.    Physical Exam: Blood pressure 130/76, pulse (!) 54, height 5\' 11"  (1.803 m), weight 193 lb 6.4 oz (87.7 kg), SpO2 97 %.  GEN:  Well nourished, well developed in no acute distress HEENT: Normal NECK: No JVD; No carotid bruits LYMPHATICS: No lymphadenopathy CARDIAC: RRR ,  soft systolic murmur  RESPIRATORY:  Clear to auscultation without rales, wheezing or rhonchi  ABDOMEN: Soft, non-tender, non-distended MUSCULOSKELETAL:  No edema; No deformity  SKIN: Warm and dry NEUROLOGIC:   Alert and oriented x 3     EKG:     Feb. 2, 2023:   sinus brady at 54.  TWI in V4-V6.   Recent Labs: No results found for requested labs within last 8760 hours.    Lipid Panel    Component Value Date/Time   CHOL 140 03/25/2016 0732   TRIG 114 03/25/2016 0732   HDL 45 03/25/2016 0732   CHOLHDL 3.1 03/25/2016 0732   CHOLHDL 3 05/09/2014 0758   VLDL 18.8 05/09/2014 0758   LDLCALC 72 03/25/2016 0732      Wt Readings from Last 3 Encounters:  02/13/21 193 lb 6.4 oz (87.7 kg)  04/28/19 192 lb 4 oz (87.2 kg)  03/08/18 186 lb 12.8 oz (84.7 kg)      Other studies Reviewed: Additional studies/ records that were reviewed today include: . Review of the above records demonstrates:    ASSESSMENT AND PLAN:  1. Hyperlipidemia -continue atorvastatin.  We will check lipids, ALT, basic metabolic profile today.   2. Mitral Valve prolapse with mild mitral regurgitation.   Is mitral valve prolapse sounds very stable.  We will not repeat his echo quite yet.   Current medicines are reviewed at length with the patient today.  The patient does not have concerns regarding medicines.  The following changes have been made:  no change  Labs/ tests ordered today include:   Orders Placed This Encounter  Procedures   Lipid panel   Basic Metabolic Panel (BMET)   ALT   EKG 12-Lead      Disposition:   FU with me in 1 year     Signed, Mertie Moores, MD  02/13/2021 5:18 PM    [facility] [place], [place], [place]  [address] Phone: [phone]; Fax: [phone]

## 2021-02-13 NOTE — Patient Instructions (Addendum)
Medication Instructions:  Your physician recommends that you continue on your current medications as directed. Please refer to the Current Medication list given to you today.  *If you need a refill on your cardiac medications before your next appointment, please call your pharmacy*   Lab Work: TODAY:  BMP, ALT, & LIPID  If you have labs (blood work) drawn today and your tests are completely normal, you will receive your results only by: [place] (if you have MyChart) OR A paper copy in the mail If you have any lab test that is abnormal or we need to change your treatment, we will call you to review the results.   Testing/Procedures: None ordered   Follow-Up: At [facility], you and your health needs are our priority.  As part of our continuing mission to provide you with exceptional heart care, we have created designated Provider Care Teams.  These Care Teams include your primary Cardiologist (physician) and Advanced Practice Providers (APPs -  Physician Assistants and Nurse Practitioners) who all work together to provide you with the care you need, when you need it.  We recommend signing up for the patient portal called "MyChart".  Sign up information is provided on this After Visit Summary.  MyChart is used to connect with patients for Virtual Visits (Telemedicine).  Patients are able to view lab/test results, encounter notes, upcoming appointments, etc.  Non-urgent messages can be sent to your provider as well.   To learn more about what you can do with MyChart, go to [email or web address].    Your next appointment:   12 month(s)  The format for your next appointment:   In Person  Provider:   Mertie Moores, MD  or Robbie Lis, PA-C, Christen Bame, NP, or Richardson Dopp, PA-C         Other Instructions

## 2021-02-14 DIAGNOSIS — H35371 Puckering of macula, right eye: Secondary | ICD-10-CM | POA: Diagnosis not present

## 2021-02-14 LAB — BASIC METABOLIC PANEL
BUN/Creatinine Ratio: 14 (ref 10–24)
BUN: 17 mg/dL (ref 8–27)
CO2: 24 mmol/L (ref 20–29)
Calcium: 9.3 mg/dL (ref 8.6–10.2)
Chloride: 104 mmol/L (ref 96–106)
Creatinine, Ser: 1.19 mg/dL (ref 0.76–1.27)
Glucose: 84 mg/dL (ref 70–99)
Potassium: 4.5 mmol/L (ref 3.5–5.2)
Sodium: 140 mmol/L (ref 134–144)
eGFR: 68 mL/min/{1.73_m2} (ref >59)

## 2021-02-14 LAB — LIPID PANEL
Chol/HDL Ratio: 3.4 ratio (ref 0.0–5.0)
Cholesterol, Total: 154 mg/dL (ref 100–199)
HDL: 45 mg/dL (ref >39)
LDL Chol Calc (NIH): 76 mg/dL (ref 0–99)
Triglycerides: 199 mg/dL — ABNORMAL HIGH (ref 0–149)
VLDL Cholesterol Cal: 33 mg/dL (ref 5–40)

## 2021-02-14 LAB — ALT: ALT: 25 IU/L (ref 0–44)

## 2021-03-17 DIAGNOSIS — Z0000 Encounter for general adult medical examination without abnormal findings: Secondary | ICD-10-CM | POA: Diagnosis not present

## 2021-03-17 DIAGNOSIS — E785 Hyperlipidemia, unspecified: Secondary | ICD-10-CM | POA: Diagnosis not present

## 2021-03-25 DIAGNOSIS — D225 Melanocytic nevi of trunk: Secondary | ICD-10-CM | POA: Diagnosis not present

## 2021-03-25 DIAGNOSIS — L438 Other lichen planus: Secondary | ICD-10-CM | POA: Diagnosis not present

## 2021-03-25 DIAGNOSIS — D485 Neoplasm of uncertain behavior of skin: Secondary | ICD-10-CM | POA: Diagnosis not present

## 2021-03-25 DIAGNOSIS — D1801 Hemangioma of skin and subcutaneous tissue: Secondary | ICD-10-CM | POA: Diagnosis not present

## 2021-03-25 DIAGNOSIS — L821 Other seborrheic keratosis: Secondary | ICD-10-CM | POA: Diagnosis not present

## 2021-03-25 DIAGNOSIS — C44222 Squamous cell carcinoma of skin of right ear and external auricular canal: Secondary | ICD-10-CM | POA: Diagnosis not present

## 2021-03-25 DIAGNOSIS — L814 Other melanin hyperpigmentation: Secondary | ICD-10-CM | POA: Diagnosis not present

## 2021-04-01 DIAGNOSIS — H04123 Dry eye syndrome of bilateral lacrimal glands: Secondary | ICD-10-CM | POA: Diagnosis not present

## 2021-04-18 DIAGNOSIS — R110 Nausea: Secondary | ICD-10-CM | POA: Diagnosis not present

## 2021-04-30 DIAGNOSIS — C44222 Squamous cell carcinoma of skin of right ear and external auricular canal: Secondary | ICD-10-CM | POA: Diagnosis not present

## 2021-05-13 DIAGNOSIS — D485 Neoplasm of uncertain behavior of skin: Secondary | ICD-10-CM | POA: Diagnosis not present

## 2021-05-13 DIAGNOSIS — D225 Melanocytic nevi of trunk: Secondary | ICD-10-CM | POA: Diagnosis not present

## 2021-05-13 DIAGNOSIS — L820 Inflamed seborrheic keratosis: Secondary | ICD-10-CM | POA: Diagnosis not present

## 2021-07-01 DIAGNOSIS — Z20822 Contact with and (suspected) exposure to covid-19: Secondary | ICD-10-CM | POA: Diagnosis not present

## 2021-07-01 DIAGNOSIS — U071 COVID-19: Secondary | ICD-10-CM | POA: Diagnosis not present

## 2021-07-13 DIAGNOSIS — J019 Acute sinusitis, unspecified: Secondary | ICD-10-CM | POA: Diagnosis not present

## 2021-07-30 DIAGNOSIS — M25561 Pain in right knee: Secondary | ICD-10-CM | POA: Diagnosis not present

## 2021-07-30 DIAGNOSIS — M1711 Unilateral primary osteoarthritis, right knee: Secondary | ICD-10-CM | POA: Diagnosis not present

## 2021-07-31 DIAGNOSIS — H5213 Myopia, bilateral: Secondary | ICD-10-CM | POA: Diagnosis not present

## 2021-07-31 DIAGNOSIS — H04123 Dry eye syndrome of bilateral lacrimal glands: Secondary | ICD-10-CM | POA: Diagnosis not present

## 2021-07-31 DIAGNOSIS — H2513 Age-related nuclear cataract, bilateral: Secondary | ICD-10-CM | POA: Diagnosis not present

## 2021-08-07 DIAGNOSIS — M25561 Pain in right knee: Secondary | ICD-10-CM | POA: Diagnosis not present

## 2021-08-15 DIAGNOSIS — M25562 Pain in left knee: Secondary | ICD-10-CM | POA: Diagnosis not present

## 2021-08-15 DIAGNOSIS — M1711 Unilateral primary osteoarthritis, right knee: Secondary | ICD-10-CM | POA: Diagnosis not present

## 2021-08-19 ENCOUNTER — Other Ambulatory Visit: Payer: Self-pay | Admitting: Gastroenterology

## 2021-08-19 DIAGNOSIS — R1084 Generalized abdominal pain: Secondary | ICD-10-CM | POA: Diagnosis not present

## 2021-08-21 ENCOUNTER — Ambulatory Visit
Admission: RE | Admit: 2021-08-21 | Discharge: 2021-08-21 | Disposition: A | Discharge status: Home / Self Care | Payer: BC Managed Care – PPO | Source: Ambulatory Visit | Attending: Gastroenterology | Admitting: Gastroenterology

## 2021-08-21 DIAGNOSIS — R1084 Generalized abdominal pain: Secondary | ICD-10-CM | POA: Diagnosis not present

## 2021-08-22 ENCOUNTER — Other Ambulatory Visit: Payer: Self-pay | Admitting: Gastroenterology

## 2021-08-22 DIAGNOSIS — R9389 Abnormal findings on diagnostic imaging of other specified body structures: Secondary | ICD-10-CM

## 2021-09-03 ENCOUNTER — Ambulatory Visit
Admission: RE | Admit: 2021-09-03 | Discharge: 2021-09-03 | Disposition: A | Discharge status: Home / Self Care | Payer: BC Managed Care – PPO | Source: Ambulatory Visit | Attending: Gastroenterology | Admitting: Gastroenterology

## 2021-09-03 DIAGNOSIS — R9389 Abnormal findings on diagnostic imaging of other specified body structures: Secondary | ICD-10-CM

## 2021-09-03 DIAGNOSIS — F411 Generalized anxiety disorder: Secondary | ICD-10-CM | POA: Diagnosis not present

## 2021-09-03 DIAGNOSIS — K760 Fatty (change of) liver, not elsewhere classified: Secondary | ICD-10-CM | POA: Diagnosis not present

## 2021-09-03 DIAGNOSIS — R1084 Generalized abdominal pain: Secondary | ICD-10-CM | POA: Diagnosis not present

## 2021-09-03 MED ORDER — GADOBENATE DIMEGLUMINE 529 MG/ML IV SOLN
18.0000 mL | Freq: Once | INTRAVENOUS | Status: AC | PRN
  Administered 2021-09-03: 18 mL via INTRAVENOUS

## 2021-09-11 ENCOUNTER — Telehealth: Payer: Self-pay | Admitting: Gastroenterology

## 2021-09-11 NOTE — Telephone Encounter (Signed)
Good afternoon Dr. Tarri Glenn,  The following patient is being referred to [place] from [facility] with abdominal cramping. He is wanting to come to [place] to get a better handle on his care. You are the supervising MD for date of referral. Records are attached. Please review and advise of scheduling. Thank you.

## 2021-09-18 ENCOUNTER — Encounter: Payer: Self-pay | Admitting: Gastroenterology

## 2021-09-30 DIAGNOSIS — H04123 Dry eye syndrome of bilateral lacrimal glands: Secondary | ICD-10-CM | POA: Diagnosis not present

## 2021-10-24 DIAGNOSIS — N5201 Erectile dysfunction due to arterial insufficiency: Secondary | ICD-10-CM | POA: Diagnosis not present

## 2021-10-24 DIAGNOSIS — N401 Enlarged prostate with lower urinary tract symptoms: Secondary | ICD-10-CM | POA: Diagnosis not present

## 2021-10-24 DIAGNOSIS — R351 Nocturia: Secondary | ICD-10-CM | POA: Diagnosis not present

## 2021-10-28 ENCOUNTER — Other Ambulatory Visit: Payer: Self-pay

## 2021-10-28 DIAGNOSIS — I341 Nonrheumatic mitral (valve) prolapse: Secondary | ICD-10-CM

## 2021-10-28 DIAGNOSIS — E782 Mixed hyperlipidemia: Secondary | ICD-10-CM

## 2021-10-28 MED ORDER — ATORVASTATIN CALCIUM 20 MG PO TABS: ORAL_TABLET | ORAL | 1 refills | Status: DC

## 2021-11-04 ENCOUNTER — Encounter: Payer: Self-pay | Admitting: Gastroenterology

## 2021-11-04 ENCOUNTER — Ambulatory Visit (INDEPENDENT_AMBULATORY_CARE_PROVIDER_SITE_OTHER): Payer: BC Managed Care – PPO | Admitting: Gastroenterology

## 2021-11-04 VITALS — BP 118/70 | HR 47 | Ht 71.0 in | Wt 193.8 lb

## 2021-11-04 DIAGNOSIS — R109 Unspecified abdominal pain: Secondary | ICD-10-CM

## 2021-11-04 DIAGNOSIS — K760 Fatty (change of) liver, not elsewhere classified: Secondary | ICD-10-CM

## 2021-11-04 MED ORDER — DICYCLOMINE HCL 10 MG PO CAPS: 10.0000 mg | ORAL_CAPSULE | Freq: Three times a day (TID) | ORAL | 3 refills | Status: DC

## 2021-11-04 NOTE — Progress Notes (Unsigned)
Referring Provider: Kathyrn Lass, MD Primary Care Physician:  Kathyrn Lass, MD  Reason for Consultation:  Abdominal cramping   IMPRESSION:  Abdominal pain. ? Symptomatic gallbladder disease.   Fatty liver on imaging with normal liver enzymes and platelets. On Lipitor. Consider additional evaluation for concurrent liver disease. Reviewed natural history of treatment of fatty liver.  Reflux with small hiatal hernia on prior EGD. Breakthrough symptoms occur weekly.    History of colon polyps. Polyp removed in 2020. Pathology unknown. Surveillance colonoscopy due 2027.   PLAN: - Continue famotidine 40 mg daily - HIDA with CCK - Dicyclomine 10 mg QID PRN - EGD if HIDA is negative - Colonoscopy 2027 - See patient instructions for additional lifestyle recommendations  HPI: Kevin Reyes is a 66 y.o. male requesting a transfer of care from [place] for further evaluation of abdominal cramping.  Previously seen by Dr. Cristina Gong for reflux, colon cancer screening, and abdominal pain.  Last office visit with Edwyna Perfect 08/19/2021.  He has a history of hypercholesterolemia, seasonal allergies, tension headaches, and anxiety. He had Covid in June. He is a Manufacturing systems engineer.   Developed intermittent abdominal cramping in April with bloating.  Symptoms are predominantly postprandially and occur most frequently with spicy or greasy foods. Cramping occurs one hour after eating and resolve within an hour.  Symptoms are adequately controlled on a bland diet.  There is no associated nausea, vomiting, change in bowel habits, weight loss, blood in the stool.  Initially improved with empiric ondansetron provided by his  PCP. He finds that lying down and relaxing provides relief.   He has intermittent diarrhea. No constipation. No blood or mucous in the stool.   Has been on PPI for heartburn despite famotidine. He is still having intermittent problems at least once or twice a week.   Labs 08/19/2021  show a normal comprehensive metabolic panel and normal CBC  Abdominal ultrasound 08/21/2021 showed echogenic liver.  Within the right hepatic lobe there is a most focal area of increased echogenicity which may represent steatosis.  No gallstones.  No acute cholecystitis.  MRI with and without contrast for abdominal pain 09/03/2021 showed moderate to marked fatty steatosis with geographic areas of variable fat deposition accounting for abnormality seen on previous ultrasound.  No suspicious hepatic lesion.  No acute abdominal findings.  Endoscopic history: - EGD 07/2009 for epigastric pain was normal except for small hiatal hernia, symptoms different at that time - Colonoscopy 08/01/2018 showed 1 tubular adenoma in the transverse colon, mild sigmoid, diverticulosis. Pathology results are unknown to me.   Maternal aunt with colon cancer in her 38s. There is no other known family history of colon cancer or polyps. No family history of stomach cancer or other GI malignancy. No family history of inflammatory bowel disease or celiac.    Past Medical History:  Diagnosis Date   Bradycardia    asymptomatic   Fatty liver    GERD (gastroesophageal reflux disease)    Hyperlipidemia    Mitral regurgitation    mild per echo in 2010   MVP (mitral valve prolapse)    Echo in 2010 with minimal prolapse of the posterior leaflet and mild MR, trace TR.     Past Surgical History:  Procedure Laterality Date   US ECHOCARDIOGRAPHY  05/08/2008   EF 55-60%   US ECHOCARDIOGRAPHY  08/23/2002   EF 60-65%      Current Outpatient Medications  Medication Sig Dispense Refill   atorvastatin (LIPITOR) 20 MG  tablet TAKE 1 TABLET BY MOUTH DAILY AT 6 PM. PLEASE MAKE OVERDUE APPT BEFORE ANYMORE REFILLS 90 tablet 1   busPIRone (BUSPAR) 30 MG tablet Take 10 mg by mouth 3 (three) times daily.     Cetirizine HCl (ZYRTEC PO) Take 10 mg by mouth daily.      famotidine (PEPCID) 40 MG tablet Take 40 mg by mouth daily.      finasteride (PROSCAR) 5 MG tablet Take 5 mg by mouth daily.  3   fluticasone (FLONASE) 50 MCG/ACT nasal spray 1 spray in each nostril     gabapentin (NEURONTIN) 300 MG capsule Take 2 capsules by mouth daily.     propranolol (INDERAL) 20 MG tablet Take 20 mg by mouth 3 (three) times daily.     sildenafil (VIAGRA) 100 MG tablet 1 tablet as needed     No current facility-administered medications for this visit.    Allergies as of 11/04/2021   (No Known Allergies)    Family History  Problem Relation Age of Onset   Hypertension Mother    Sudden death Neg Hx    Hyperlipidemia Neg Hx    Heart attack Neg Hx    Diabetes Neg Hx     Social History   Socioeconomic History   Marital status: Married    Spouse name: Not on file   Number of children: Not on file   Years of education: Not on file   Highest education level: Not on file  Occupational History   Not on file  Tobacco Use   Smoking status: Never   Smokeless tobacco: Never  Substance and Sexual Activity   Alcohol use: Yes    Alcohol/week: 4.0 standard drinks of alcohol    Types: 4 Shots of liquor per week    Comment: social use   Drug use: No   Sexual activity: Not on file  Other Topics Concern   Not on file  Social History Narrative   Not on file   Social Determinants of Health   Financial Resource Strain: Not on file  Food Insecurity: Not on file  Transportation Needs: Not on file  Physical Activity: Not on file  Stress: Not on file  Social Connections: Not on file  Intimate Partner Violence: Not on file    Review of Systems: 12 system ROS is negative except as noted above with allergies, anxiety, arthritis, headaches, itching, skin rash.   Physical Exam: General:   Alert,  well-nourished, pleasant and cooperative in NAD Head:  Normocephalic and atraumatic. Eyes:  Sclera clear, no icterus.   Conjunctiva pink. Ears:  Normal auditory acuity. Nose:  No deformity, discharge,  or lesions. Mouth:  No deformity  or lesions.   Neck:  Supple; no masses or thyromegaly. Lungs:  Clear throughout to auscultation.   No wheezes. Heart:  Regular rate and rhythm; no murmurs. Abdomen:  Soft, nontender, nondistended, normal bowel sounds, no rebound or guarding. No hepatosplenomegaly.   Rectal:  Deferred  Msk:  Symmetrical. No boney deformities LAD: No inguinal or umbilical LAD Extremities:  No clubbing or edema. Neurologic:  Alert and  oriented x4;  grossly nonfocal Skin:  Intact without significant lesions or rashes. Psych:  Alert and cooperative. Normal mood and affect.    Renell Coaxum L. Tarri Glenn, MD, MPH 11/04/2021, 3:28 PM

## 2021-11-04 NOTE — Patient Instructions (Signed)
I have recommended additional testing of your gallbladder.  You have been scheduled for a HIDA scan at [facility] (1st floor) on Monday 11/17/21. Please arrive 15 minutes prior to your scheduled appointment at  11 am. Make certain not to have anything to eat or drink at least 6 hours prior to your test. Should this appointment date or time not work well for you, please call radiology scheduling at [phone].  _____________________________________________________________________ hepatobiliary (HIDA) scan is an imaging procedure used to diagnose problems in the liver, gallbladder and bile ducts. In the HIDA scan, a radioactive chemical or tracer is injected into a vein in your arm. The tracer is handled by the liver like bile. Bile is a fluid produced and excreted by your liver that helps your digestive system break down fats in the foods you eat. Bile is stored in your gallbladder and the gallbladder releases the bile when you eat a meal. A special nuclear medicine scanner (gamma camera) tracks the flow of the tracer from your liver into your gallbladder and small intestine.  During your HIDA scan  You'll be asked to change into a hospital gown before your HIDA scan begins. Your health care team will position you on a table, usually on your back. The radioactive tracer is then injected into a vein in your arm.The tracer travels through your bloodstream to your liver, where it's taken up by the bile-producing cells. The radioactive tracer travels with the bile from your liver into your gallbladder and through your bile ducts to your small intestine.You may feel some pressure while the radioactive tracer is injected into your vein. As you lie on the table, a special gamma camera is positioned over your abdomen taking pictures of the tracer as it moves through your body. The gamma camera takes pictures continually for about an hour. You'll need to keep still during the HIDA scan. This can become  uncomfortable, but you may find that you can lessen the discomfort by taking deep breaths and thinking about other things. Tell your health care team if you're uncomfortable. The radiologist will watch on a computer the progress of the radioactive tracer through your body. The HIDA scan may be stopped when the radioactive tracer is seen in the gallbladder and enters your small intestine. This typically takes about an hour. In some cases extra imaging will be performed if original images aren't satisfactory, if morphine is given to help visualize the gallbladder or if the medication CCK is given to look at the contraction of the gallbladder. This test typically takes 2 hours to complete. ________________________________________________________________________  We discussed a trial of dicyclomine 10 mg four times daily. I think this might be most helpful taken prior to meals.   Continue the famotidine 40 mg daily.   Treatment for fatty liver usually starts with weight loss. This can be done by eating a healthy diet, limiting portion sizes and exercise. Losing weight may improve other health problems that lead to fatty liver. Typically, losing 10% of your body weight or more is recommended. But losing even 3% to 5% of your starting weight can have benefits.    No medicines have been approved to treat MAFLD/NAFLD or NASH, but a few drugs are being studied in clinical trials that may help. Hopefully, treatments will be available in the future.   Lifestyle and home remedies can make a difference with your liver health. You can:  - Lose weight. If you're overweight or obese, reduce the number of calories  you eat each day and increase your physical activity to lose weight slowly. Eating fewer calories is key to losing weight and managing this disease. - Choose a healthy diet. Eat a healthy diet that's rich in fruits, vegetables and whole grains. Your health care team may suggest avoiding or limiting certain  foods and drinks, such as white bread, red and processed meats, juices, and sweetened drinks. Keep track of all calories you take in. - Exercise and be more active. Aim for at least 150 minutes of exercise a week. If you're trying to lose weight, you might find that more exercise is helpful.  - High fructose corn syrup should be avoided as it has been associated with fatty liver.  - Lower your cholesterol and blood pressure. Improve your cholesterol levels and blood pressure if they are high. A healthy diet, exercise and medicines can help keep your cholesterol, triglycerides and blood pressure at healthy levels. Please discuss cholesterol treatment with with Dr. Ronnald Ramp.  - Protect your liver. Avoid things that could harm your liver health. For example, don't drink alcohol. Follow the instructions on all medicines and nonprescription drugs. Avoid herbal supplements, as some can harm the liver. - Caffeinated coffee. Some studies suggest that coffee may benefit the liver by reducing the risk of liver diseases like NAFLD and lowering the chance of scarring. It's not yet clear how coffee may prevent liver damage. But certain compounds in coffee are thought to lower inflammation and slow scar tissue growth. If you already drink coffee, these results may make you feel better about your morning cup. But if you don't already drink coffee, this probably isn't a good reason to start. - A good night sleep helps prevent fatty liver.  - Vitamin E. In theory, vitamin E and other vitamins called antioxidants could help protect the liver by reducing or canceling out the damage caused by inflammation. But more research is needed. Some evidence suggests vitamin E supplements may be helpful for people with fatty liver who don't have type 2 diabetes. Vitamin E has been linked with a slightly increased risk of heart disease and prostate cancer so I don't routinely recommend it.  Please take any heart symptoms seriously given the  risk of heart disease in the setting of fatty liver.

## 2021-11-06 DIAGNOSIS — G8929 Other chronic pain: Secondary | ICD-10-CM | POA: Diagnosis not present

## 2021-11-06 DIAGNOSIS — M25561 Pain in right knee: Secondary | ICD-10-CM | POA: Diagnosis not present

## 2021-11-06 DIAGNOSIS — M1711 Unilateral primary osteoarthritis, right knee: Secondary | ICD-10-CM | POA: Diagnosis not present

## 2021-11-14 ENCOUNTER — Telehealth: Payer: Self-pay

## 2021-11-14 NOTE — Telephone Encounter (Signed)
   Name: Kevin Reyes  DOB: [birth year]  MRN: [id number]  Primary Cardiologist: Mertie Moores, MD   Preoperative team, please contact this patient and set up a phone call appointment for further preoperative risk assessment. Please obtain consent and complete medication review. Thank you for your help.  I confirm that guidance regarding antiplatelet and oral anticoagulation therapy has been completed and, if necessary, noted below.   Lenna Sciara, NP 11/14/2021, 12:40 PM [place]

## 2021-11-14 NOTE — Telephone Encounter (Signed)
   Pre-operative Risk Assessment    Patient Name: Kevin Reyes  DOB: December 14, 1955 MRN: [id number]     Request for Surgical Clearance    Procedure:  Right UKA Medially   Date of Surgery:  Clearance 01/20/22                                Surgeon:  Dr. Paralee Cancel Surgeon's Group or Practice Name:  [facility]  Phone number:  [phone] Fax number:  [phone]  Type of Clearance Requested:   - Medical    Type of Anesthesia:  Spinal   Additional requests/questions:    SignedMendel Ryder   11/14/2021, 8:16 AM

## 2021-11-14 NOTE — Telephone Encounter (Signed)
Pt is scheduled for a tele visit on 11/19/21. Med rec and consent done

## 2021-11-14 NOTE — Telephone Encounter (Signed)
  Patient Consent for Virtual Visit         Kevin Reyes has provided verbal consent on 11/14/2021 for a virtual visit (video or telephone).   CONSENT FOR VIRTUAL VISIT FOR:  Kevin Reyes  By participating in this virtual visit I agree to the following:  I hereby voluntarily request, consent and authorize [facility] and its employed or contracted physicians, physician assistants, nurse practitioners or other licensed health care professionals (the Practitioner), to provide me with telemedicine health care services (the "Services") as deemed necessary by the treating Practitioner. I acknowledge and consent to receive the Services by the Practitioner via telemedicine. I understand that the telemedicine visit will involve communicating with the Practitioner through live audiovisual communication technology and the disclosure of certain medical information by electronic transmission. I acknowledge that I have been given the opportunity to request an in-person assessment or other available alternative prior to the telemedicine visit and am voluntarily participating in the telemedicine visit.  I understand that I have the right to withhold or withdraw my consent to the use of telemedicine in the course of my care at any time, without affecting my right to future care or treatment, and that the Practitioner or I may terminate the telemedicine visit at any time. I understand that I have the right to inspect all information obtained and/or recorded in the course of the telemedicine visit and may receive copies of available information for a reasonable fee.  I understand that some of the potential risks of receiving the Services via telemedicine include:  Delay or interruption in medical evaluation due to technological equipment failure or disruption; Information transmitted may not be sufficient (e.g. poor resolution of images) to allow for appropriate medical decision making by the  Practitioner; and/or  In rare instances, security protocols could fail, causing a breach of personal health information.  Furthermore, I acknowledge that it is my responsibility to provide information about my medical history, conditions and care that is complete and accurate to the best of my ability. I acknowledge that Practitioner's advice, recommendations, and/or decision may be based on factors not within their control, such as incomplete or inaccurate data provided by me or distortions of diagnostic images or specimens that may result from electronic transmissions. I understand that the practice of medicine is not an exact science and that Practitioner makes no warranties or guarantees regarding treatment outcomes. I acknowledge that a copy of this consent can be made available to me via my patient portal ([facility]), or I can request a printed copy by calling the office of [facility].    I understand that my insurance will be billed for this visit.   I have read or had this consent read to me. I understand the contents of this consent, which adequately explains the benefits and risks of the Services being provided via telemedicine.  I have been provided ample opportunity to ask questions regarding this consent and the Services and have had my questions answered to my satisfaction. I give my informed consent for the services to be provided through the use of telemedicine in my medical care

## 2021-11-17 ENCOUNTER — Encounter (HOSPITAL_COMMUNITY)
Admission: RE | Admit: 2021-11-17 | Discharge: 2021-11-17 | Disposition: A | Discharge status: Home / Self Care | Payer: BC Managed Care – PPO | Source: Ambulatory Visit | Attending: Gastroenterology | Admitting: Gastroenterology

## 2021-11-17 DIAGNOSIS — R109 Unspecified abdominal pain: Secondary | ICD-10-CM | POA: Insufficient documentation

## 2021-11-17 DIAGNOSIS — K760 Fatty (change of) liver, not elsewhere classified: Secondary | ICD-10-CM | POA: Diagnosis not present

## 2021-11-17 MED ORDER — TECHNETIUM TC 99M MEBROFENIN IV KIT
5.2600 | PACK | Freq: Once | INTRAVENOUS | Status: AC | PRN
  Administered 2021-11-17: 5.26 via INTRAVENOUS

## 2021-11-18 DIAGNOSIS — L570 Actinic keratosis: Secondary | ICD-10-CM | POA: Diagnosis not present

## 2021-11-18 DIAGNOSIS — L821 Other seborrheic keratosis: Secondary | ICD-10-CM | POA: Diagnosis not present

## 2021-11-18 DIAGNOSIS — L438 Other lichen planus: Secondary | ICD-10-CM | POA: Diagnosis not present

## 2021-11-18 DIAGNOSIS — D225 Melanocytic nevi of trunk: Secondary | ICD-10-CM | POA: Diagnosis not present

## 2021-11-18 DIAGNOSIS — D1801 Hemangioma of skin and subcutaneous tissue: Secondary | ICD-10-CM | POA: Diagnosis not present

## 2021-11-18 DIAGNOSIS — Z85828 Personal history of other malignant neoplasm of skin: Secondary | ICD-10-CM | POA: Diagnosis not present

## 2021-11-19 ENCOUNTER — Other Ambulatory Visit: Payer: Self-pay

## 2021-11-19 ENCOUNTER — Ambulatory Visit: Payer: BC Managed Care – PPO | Attending: Cardiology | Admitting: General Practice

## 2021-11-19 DIAGNOSIS — R197 Diarrhea, unspecified: Secondary | ICD-10-CM

## 2021-11-19 DIAGNOSIS — Z01810 Encounter for preprocedural cardiovascular examination: Secondary | ICD-10-CM

## 2021-11-19 DIAGNOSIS — R109 Unspecified abdominal pain: Secondary | ICD-10-CM

## 2021-11-19 DIAGNOSIS — K760 Fatty (change of) liver, not elsewhere classified: Secondary | ICD-10-CM

## 2021-11-19 NOTE — Progress Notes (Signed)
Virtual Visit via Telephone Note   Because of Kevin Reyes's co-morbid illnesses, he is at least at moderate risk for complications without adequate follow up.  This format is felt to be most appropriate for this patient at this time.  The patient did not have access to video technology/had technical difficulties with video requiring transitioning to audio format only (telephone).  All issues noted in this document were discussed and addressed.  No physical exam could be performed with this format.  Please refer to the patient's chart for his consent to telehealth for [facility].  Evaluation Performed:  Preoperative cardiovascular risk assessment _____________   Date:  11/19/2021   Patient ID:  Kevin Reyes, DOB [birth year], MRN [id number] Patient Location:  Home Provider location:   Office  Primary Care Provider:  Kathyrn Lass, MD Primary Cardiologist:  Mertie Moores, MD  Chief Complaint / Patient Profile   66 y.o. y/o male with a h/o MDD, hyperlipidemia, bradycardia who is pending right [facility] medially and presents today for telephonic preoperative cardiovascular risk assessment.  Past Medical History    Past Medical History:  Diagnosis Date   Bradycardia    asymptomatic   Fatty liver    GERD (gastroesophageal reflux disease)    Hyperlipidemia    Mitral regurgitation    mild per echo in 2010   MVP (mitral valve prolapse)    Echo in 2010 with minimal prolapse of the posterior leaflet and mild MR, trace TR.    Past Surgical History:  Procedure Laterality Date   US ECHOCARDIOGRAPHY  05/08/2008   EF 55-60%   US ECHOCARDIOGRAPHY  08/23/2002   EF 60-65%    Allergies  No Known Allergies  History of Present Illness    Kevin Reyes is a 66 y.o. male who presents via audio/video conferencing for a telehealth visit today.  Pt was last seen in cardiology clinic on 02/13/2021 by Dr. Acie Fredrickson.  At that time Kevin Reyes was doing well .  The patient is now  pending procedure as outlined above. Since his last visit, he remains stable from a cardiac standpoint.  Today he denies chest pain, shortness of breath, lower extremity edema, fatigue, palpitations, melena, hematuria, hemoptysis, diaphoresis, weakness, presyncope, syncope, orthopnea, and PND.   Home Medications    Prior to Admission medications   Medication Sig Start Date End Date Taking? Authorizing Provider  atorvastatin (LIPITOR) 20 MG tablet TAKE 1 TABLET BY MOUTH DAILY AT 6 PM. PLEASE MAKE OVERDUE APPT BEFORE ANYMORE REFILLS 10/28/21   Nahser, Wonda Cheng, MD  busPIRone (BUSPAR) 30 MG tablet Take 10 mg by mouth 3 (three) times daily.    [provider]  Cetirizine HCl (ZYRTEC PO) Take 10 mg by mouth daily.     [provider]  dicyclomine (BENTYL) 10 MG capsule Take 1 capsule (10 mg total) by mouth 4 (four) times daily -  before meals and at bedtime. Patient not taking: Reported on 11/14/2021 11/04/21   Thornton Park, MD  famotidine (PEPCID) 40 MG tablet Take 40 mg by mouth daily. 08/20/21   [provider]  finasteride (PROSCAR) 5 MG tablet Take 5 mg by mouth daily. 02/25/14   [provider]  fluticasone Asencion Islam) 50 MCG/ACT nasal spray 1 spray in each nostril 01/23/15   [provider]  gabapentin (NEURONTIN) 300 MG capsule Take 2 capsules by mouth daily. 05/12/18   [provider]  propranolol (INDERAL) 20 MG tablet Take 20 mg by mouth 3 (  three) times daily. 09/29/21   [provider]  sildenafil (VIAGRA) 100 MG tablet 1 tablet as needed    [provider]    Physical Exam    Vital Signs:  Kevin Reyes does not have vital signs available for review today.  Given telephonic nature of communication, physical exam is limited. AAOx3. NAD. Normal affect.  Speech and respirations are unlabored.  Accessory Clinical Findings    None  Assessment & Plan    1.  Preoperative Cardiovascular Risk Assessment: Right  UKA medially, Dr. Alvan Dame, [facility]      Primary Cardiologist: Mertie Moores, MD  Chart reviewed as part of pre-operative protocol coverage. Given past medical history and time since last visit, based on ACC/AHA guidelines, Kevin Reyes would be at acceptable risk for the planned procedure without further cardiovascular testing.   Patient was advised that if he develops new symptoms prior to surgery to contact our office to arrange a follow-up appointment.  She verbalized understanding.  His RCRI is a class I risk, 0.4% risk of major cardiac event.  He is able to complete greater than 4 METS of physical activity.  I will route this recommendation to the requesting party via Epic fax function and remove from pre-op pool.      Time:   Today, I have spent 5 minutes with the patient with telehealth technology discussing medical history, symptoms, and management plan.  Prior to his phone evaluation I spent greater than 10 minutes reviewing his past medical history and cardiac medications   Deberah Pelton, NP  11/19/2021, 8:05 AM

## 2021-12-10 ENCOUNTER — Encounter: Payer: Self-pay | Admitting: Gastroenterology

## 2021-12-18 ENCOUNTER — Encounter: Payer: BC Managed Care – PPO | Admitting: Gastroenterology

## 2021-12-18 ENCOUNTER — Ambulatory Visit (AMBULATORY_SURGERY_CENTER): Payer: BC Managed Care – PPO | Admitting: Gastroenterology

## 2021-12-18 ENCOUNTER — Encounter: Payer: Self-pay | Admitting: Gastroenterology

## 2021-12-18 VITALS — BP 100/70 | HR 53 | Temp 96.0°F | Resp 20 | Ht 71.0 in | Wt 193.0 lb

## 2021-12-18 DIAGNOSIS — K259 Gastric ulcer, unspecified as acute or chronic, without hemorrhage or perforation: Secondary | ICD-10-CM | POA: Diagnosis not present

## 2021-12-18 DIAGNOSIS — K3189 Other diseases of stomach and duodenum: Secondary | ICD-10-CM | POA: Diagnosis not present

## 2021-12-18 DIAGNOSIS — K317 Polyp of stomach and duodenum: Secondary | ICD-10-CM | POA: Diagnosis not present

## 2021-12-18 DIAGNOSIS — K219 Gastro-esophageal reflux disease without esophagitis: Secondary | ICD-10-CM

## 2021-12-18 DIAGNOSIS — K2281 Esophageal polyp: Secondary | ICD-10-CM

## 2021-12-18 DIAGNOSIS — K2980 Duodenitis without bleeding: Secondary | ICD-10-CM | POA: Diagnosis not present

## 2021-12-18 DIAGNOSIS — R109 Unspecified abdominal pain: Secondary | ICD-10-CM

## 2021-12-18 MED ORDER — SODIUM CHLORIDE 0.9 % IV SOLN: 500.0000 mL | INTRAVENOUS | Status: DC

## 2021-12-18 MED ORDER — PANTOPRAZOLE SODIUM 40 MG PO TBEC: 40.0000 mg | DELAYED_RELEASE_TABLET | Freq: Two times a day (BID) | ORAL | 3 refills | Status: DC

## 2021-12-18 NOTE — Progress Notes (Signed)
Referring Provider: Kathyrn Lass, MD Primary Care Physician:  Kathyrn Lass, MD   Indication for EGD:  Abdominal pain not explained by ultrasound, MRI, or HIDA scan   IMPRESSION:  Abdominal pain not explained by ultrasound, MRI, or HIDA scan Appropriate candidate for monitored anesthesia care  PLAN: EGD in the [facility] today   HPI: Kevin Reyes is a 66 y.o. male presents for endoscopic evaluation of abdominal pain.  Developed intermittent abdominal cramping in April with bloating.  Symptoms are predominantly postprandially and occur most frequently with spicy or greasy foods. Cramping occurs one hour after eating and resolve within an hour.  Symptoms are adequately controlled on a bland diet.  There is no associated nausea, vomiting, change in bowel habits, weight loss, blood in the stool.  Initially improved with empiric ondansetron provided by his  PCP. He finds that lying down and relaxing provides relief.    He has intermittent diarrhea. No constipation. No blood or mucous in the stool.    Has been on PPI for heartburn despite famotidine. He is still having intermittent problems at least once or twice a week.    Labs 08/19/2021 show a normal comprehensive metabolic panel and normal CBC   Abdominal ultrasound 08/21/2021 showed echogenic liver.  Within the right hepatic lobe there is a most focal area of increased echogenicity which may represent steatosis.  No gallstones.  No acute cholecystitis.   MRI with and without contrast for abdominal pain 09/03/2021 showed moderate to marked fatty steatosis with geographic areas of variable fat deposition accounting for abnormality seen on previous ultrasound.  No suspicious hepatic lesion.  No acute abdominal findings.  HIDA scan 11/17/2021 was normal.  Gallbladder ejection fraction of 47%.   Endoscopic history: - EGD 07/2009 for epigastric pain was normal except for small hiatal hernia, symptoms different at that time - Colonoscopy  08/01/2018 showed 1 tubular adenoma in the transverse colon, mild sigmoid, diverticulosis. Pathology results are unknown to me.    Maternal aunt with colon cancer in her 28s. There is no other known family history of colon cancer or polyps. No family history of stomach cancer or other GI malignancy. No family history of inflammatory bowel disease or celiac.      Past Medical History:  Diagnosis Date   Bradycardia    asymptomatic   Fatty liver    GERD (gastroesophageal reflux disease)    Hyperlipidemia    Mitral regurgitation    mild per echo in 2010   MVP (mitral valve prolapse)    Echo in 2010 with minimal prolapse of the posterior leaflet and mild MR, trace TR.     Past Surgical History:  Procedure Laterality Date   US ECHOCARDIOGRAPHY  05/08/2008   EF 55-60%   US ECHOCARDIOGRAPHY  08/23/2002   EF 60-65%    Current Outpatient Medications  Medication Sig Dispense Refill   atorvastatin (LIPITOR) 20 MG tablet TAKE 1 TABLET BY MOUTH DAILY AT 6 PM. PLEASE MAKE OVERDUE APPT BEFORE ANYMORE REFILLS 90 tablet 1   busPIRone (BUSPAR) 30 MG tablet Take 10 mg by mouth 3 (three) times daily.     Cetirizine HCl (ZYRTEC PO) Take 10 mg by mouth daily.      famotidine (PEPCID) 40 MG tablet Take 40 mg by mouth daily.     finasteride (PROSCAR) 5 MG tablet Take 5 mg by mouth daily.  3   gabapentin (NEURONTIN) 300 MG capsule Take 2 capsules by mouth daily.     dicyclomine (BENTYL)  10 MG capsule Take 1 capsule (10 mg total) by mouth 4 (four) times daily -  before meals and at bedtime. (Patient not taking: Reported on 11/14/2021) 120 capsule 3   fluticasone (FLONASE) 50 MCG/ACT nasal spray 1 spray in each nostril     propranolol (INDERAL) 20 MG tablet Take 20 mg by mouth 3 (three) times daily.     sildenafil (VIAGRA) 100 MG tablet 1 tablet as needed     Current Facility-Administered Medications  Medication Dose Route Frequency Provider Last Rate Last Admin   0.9 %  sodium chloride infusion  500 mL  Intravenous Continuous Thornton Park, MD        Allergies as of 12/18/2021   (No Known Allergies)    Family History  Problem Relation Age of Onset   Hypertension Mother    Sudden death Neg Hx    Hyperlipidemia Neg Hx    Heart attack Neg Hx    Diabetes Neg Hx      Physical Exam: General:   Alert,  well-nourished, pleasant and cooperative in NAD Head:  Normocephalic and atraumatic. Eyes:  Sclera clear, no icterus.   Conjunctiva pink. Mouth:  No deformity or lesions.   Neck:  Supple; no masses or thyromegaly. Lungs:  Clear throughout to auscultation.   No wheezes. Heart:  Regular rate and rhythm; no murmurs. Abdomen:  Soft, non-tender, nondistended, normal bowel sounds, no rebound or guarding.  Msk:  Symmetrical. No boney deformities LAD: No inguinal or umbilical LAD Extremities:  No clubbing or edema. Neurologic:  Alert and  oriented x4;  grossly nonfocal Skin:  No obvious rash or bruise. Psych:  Alert and cooperative. Normal mood and affect.     Studies/Results: No results found.    Shaia Porath L. Tarri Glenn, MD, MPH 12/18/2021, 10:51 AM

## 2021-12-18 NOTE — Patient Instructions (Addendum)
Patient has a contact number available for emergencies. The signs and symptoms of potential delayed complications were discussed with the patient. Return to normal activities tomorrow. Written discharge instructions were provided to the patient. - Resume previous diet. - Continue present medications. - No aspirin, ibuprofen, naproxen, or other non-steroidal anti-inflammatory drugs. - Use Protonix (pantoprazole) 40 mg PO BID for at least 10 weeks. - Office follow-up in 4-6 weeks, earlier if neede  YOU HAD AN ENDOSCOPIC PROCEDURE TODAY AT [facility]:   Refer to the procedure report that was given to you for any specific questions about what was found during the examination.  If the procedure report does not answer your questions, please call your gastroenterologist to clarify.  If you requested that your care partner not be given the details of your procedure findings, then the procedure report has been included in a sealed envelope for you to review at your convenience later.  YOU SHOULD EXPECT: Some feelings of bloating in the abdomen. Passage of more gas than usual.  Walking can help get rid of the air that was put into your GI tract during the procedure and reduce the bloating. If you had a lower endoscopy (such as a colonoscopy or flexible sigmoidoscopy) you may notice spotting of blood in your stool or on the toilet paper. If you underwent a bowel prep for your procedure, you may not have a normal bowel movement for a few days.  Please Note:  You might notice some irritation and congestion in your nose or some drainage.  This is from the oxygen used during your procedure.  There is no need for concern and it should clear up in a day or so.  SYMPTOMS TO REPORT IMMEDIATELY:   Following upper endoscopy (EGD)  Vomiting of blood or coffee ground material  New chest pain or pain under the shoulder blades  Painful or persistently difficult swallowing  New shortness of  breath  Fever of 100F or higher  Black, tarry-looking stools  For urgent or emergent issues, a gastroenterologist can be reached at any hour by calling [phone]. Do not use MyChart messaging for urgent concerns.    DIET:  We do recommend a small meal at first, but then you may proceed to your regular diet.  Drink plenty of fluids but you should avoid alcoholic beverages for 24 hours.  ACTIVITY:  You should plan to take it easy for the rest of today and you should NOT DRIVE or use heavy machinery until tomorrow (because of the sedation medicines used during the test).    FOLLOW UP: Our staff will call the number listed on your records the next business day following your procedure.  We will call around 7:15- 8:00 am to check on you and address any questions or concerns that you may have regarding the information given to you following your procedure. If we do not reach you, we will leave a message.     If any biopsies were taken you will be contacted by phone or by letter within the next 1-3 weeks.  Please call us at [phone] if you have not heard about the biopsies in 3 weeks.    SIGNATURES/CONFIDENTIALITY: You and/or your care partner have signed paperwork which will be entered into your electronic medical record.  These signatures attest to the fact that that the information above on your After Visit Summary has been reviewed and is understood.  Full responsibility of the confidentiality of this discharge information  lies with you and/or your care-partner.

## 2021-12-18 NOTE — Op Note (Addendum)
[facility] Patient Name: Kevin Reyes Procedure Date: 12/18/2021 10:49 AM MRN: [id number] Endoscopist: Thornton Park MD, MD, [id number] Age: 66 Referring MD:  Date of Birth: Oct 19, 1955 Gender: Male Account #: [id number] Procedure:                Upper GI endoscopy Indications:              Abdominal pain, Abdominal bloating, Diarrhea not                            explained by ultrasound, MRI, or HIDA scan Medicines:                Monitored Anesthesia Care Procedure:                Pre-Anesthesia Assessment:                           - Prior to the procedure, a History and Physical                            was performed, and patient medications and                            allergies were reviewed. The patient's tolerance of                            previous anesthesia was also reviewed. The risks                            and benefits of the procedure and the sedation                            options and risks were discussed with the patient.                            All questions were answered, and informed consent                            was obtained. Prior Anticoagulants: The patient has                            taken no anticoagulant or antiplatelet agents. ASA                            Grade Assessment: II - A patient with mild systemic                            disease. After reviewing the risks and benefits,                            the patient was deemed in satisfactory condition to                            undergo the procedure.  After obtaining informed consent, the endoscope was                            passed under direct vision. Throughout the                            procedure, the patient's blood pressure, pulse, and                            oxygen saturations were monitored continuously. The                            GIF [id number] #[id number] was introduced through the                            mouth,  and advanced to the third part of duodenum.                            The upper GI endoscopy was accomplished without                            difficulty. The patient tolerated the procedure                            well. Scope In: Scope Out: Findings:                 LA Grade B (one or more mucosal breaks greater than                            5 mm, not extending between the tops of two mucosal                            folds) esophagitis with no bleeding was found 38 cm                            from the incisors. Biopsies were taken with a cold                            forceps for histology. Estimated blood loss was                            minimal.                           A single small polyp with no bleeding was found 40                            cm from the incisors, just distal to the GE                            junction. Biopsies were taken with a cold forceps  for histology. Estimated blood loss was minimal.                           Patchy mildly erythematous mucosa without bleeding                            was found in the gastric antrum. Biopsies were                            taken from the antrum, body, and fundus with a cold                            forceps for histology. Estimated blood loss was                            minimal.                           A small hiatal hernia was present.                           Diffuse mildly erythematous mucosa without active                            bleeding and with no stigmata of bleeding was found                            in the duodenal bulb and in the second portion of                            the duodenum. Biopsies were taken with a cold                            forceps for histology. Estimated blood loss was                            minimal. Complications:            No immediate complications. Estimated Blood Loss:     Estimated blood loss was minimal. Impression:                - LA Grade B reflux esophagitis with no bleeding.                            Biopsied.                           - Polyp immediately distal to the GE junction.                            Biopsied.                           - Erythematous mucosa in the antrum. Biopsied.                           -  Small hiatal hernia.                           - Erythematous duodenopathy. Biopsied. Recommendation:           - Patient has a contact number available for                            emergencies. The signs and symptoms of potential                            delayed complications were discussed with the                            patient. Return to normal activities tomorrow.                            Written discharge instructions were provided to the                            patient.                           - Resume previous diet.                           - Continue present medications.                           - No aspirin, ibuprofen, naproxen, or other                            non-steroidal anti-inflammatory drugs.                           - Reflux lifestyle modifications.                           - Use Protonix (pantoprazole) 40 mg PO BID for at                            least 10 weeks.                           - Office follow-up in 4-6 weeks, earlier if needed. Thornton Park MD, MD 12/18/2021 11:16:53 AM This report has been signed electronically.

## 2021-12-18 NOTE — Progress Notes (Signed)
A and O x3. Report to RN. Tolerated MAC anesthesia well.Teeth unchanged after procedure. 

## 2021-12-18 NOTE — Progress Notes (Signed)
Called to room to assist during endoscopic procedure.  Patient ID and intended procedure confirmed with present staff. Received instructions for my participation in the procedure from the performing physician.  

## 2021-12-19 ENCOUNTER — Telehealth: Payer: Self-pay

## 2021-12-19 NOTE — Telephone Encounter (Signed)
  Follow up Call-     12/18/2021   10:07 AM  Call back number  Post procedure Call Back phone  # [phone]  Permission to leave phone message Yes     Patient questions:  Do you have a fever, pain , or abdominal swelling? No. Pain Score  0 *  Have you tolerated food without any problems? Yes.    Have you been able to return to your normal activities? Yes.    Do you have any questions about your discharge instructions: Diet   No. Medications  No. Follow up visit  No.  Do you have questions or concerns about your Care? No.  Actions: * If pain score is 4 or above: No action needed, pain <4.  Patient states he had "chills" yesterday afternoon which subsided after a couple of hours.  He denies having any fever.  Instructed patient to call emergency number which was provided to him on his discharge paperwork if he has any concerns over the weekend.  He verbalized understanding.

## 2021-12-23 ENCOUNTER — Encounter: Payer: BC Managed Care – PPO | Admitting: Gastroenterology

## 2022-01-08 ENCOUNTER — Encounter: Payer: Self-pay | Admitting: Gastroenterology

## 2022-01-13 NOTE — Progress Notes (Addendum)
COVID Vaccine Completed: yes  Date of COVID positive in last 90 days: no  PCP - Kathyrn Lass, MD Cardiologist - Cleatrice Burke, MD  Cardiac clearance by Coletta Memos 11/19/21 in [place] clearance by Faustino Congress 01/14/22 on chart  Chest x-ray - n/a EKG - 02/13/21 Epic Stress Test - n/a ECHO - 2010 Cardiac Cath - n/a Pacemaker/ICD device last checked: n/a Spinal Cord Stimulator: n/a  Bowel Prep - no  Sleep Study - n/a CPAP -   Fasting Blood Sugar - n/a Checks Blood Sugar _____ times a day  Last dose of GLP1 agonist-  N/A GLP1 instructions:  N/A   Last dose of SGLT-2 inhibitors-  N/A SGLT-2 instructions: N/A   Blood Thinner Instructions: n/a Aspirin Instructions: Last Dose:  Activity level: Can go up a flight of stairs and perform activities of daily living without stopping and without symptoms of chest pain or shortness of breath.   Anesthesia review: MVP with mild mitral regurgitation, HR 51 at PAT pt has hx of bradycardia  Patient denies shortness of breath, fever, cough and chest pain at PAT appointment  Patient verbalized understanding of instructions that were given to them at the PAT appointment. Patient was also instructed that they will need to review over the PAT instructions again at home before surgery.

## 2022-01-14 ENCOUNTER — Encounter (HOSPITAL_COMMUNITY)
Admission: RE | Admit: 2022-01-14 | Discharge: 2022-01-14 | Disposition: A | Discharge status: Home / Self Care | Payer: BC Managed Care – PPO | Source: Ambulatory Visit | Attending: Orthopedic Surgery | Admitting: Orthopedic Surgery

## 2022-01-14 ENCOUNTER — Encounter (HOSPITAL_COMMUNITY): Payer: Self-pay

## 2022-01-14 VITALS — BP 139/67 | HR 51 | Temp 97.9°F | Resp 14 | Ht 70.0 in | Wt 192.0 lb

## 2022-01-14 DIAGNOSIS — Z01812 Encounter for preprocedural laboratory examination: Secondary | ICD-10-CM | POA: Diagnosis not present

## 2022-01-14 DIAGNOSIS — I341 Nonrheumatic mitral (valve) prolapse: Secondary | ICD-10-CM | POA: Diagnosis not present

## 2022-01-14 DIAGNOSIS — N400 Enlarged prostate without lower urinary tract symptoms: Secondary | ICD-10-CM | POA: Diagnosis not present

## 2022-01-14 DIAGNOSIS — F411 Generalized anxiety disorder: Secondary | ICD-10-CM | POA: Diagnosis not present

## 2022-01-14 DIAGNOSIS — M25561 Pain in right knee: Secondary | ICD-10-CM | POA: Diagnosis not present

## 2022-01-14 DIAGNOSIS — N529 Male erectile dysfunction, unspecified: Secondary | ICD-10-CM | POA: Diagnosis not present

## 2022-01-14 DIAGNOSIS — M1711 Unilateral primary osteoarthritis, right knee: Secondary | ICD-10-CM | POA: Insufficient documentation

## 2022-01-14 DIAGNOSIS — Z01818 Encounter for other preprocedural examination: Secondary | ICD-10-CM

## 2022-01-14 HISTORY — DX: Anxiety disorder, unspecified: F41.9

## 2022-01-14 HISTORY — DX: Headache, unspecified: R51.9

## 2022-01-14 HISTORY — DX: Unspecified osteoarthritis, unspecified site: M19.90

## 2022-01-14 HISTORY — DX: Malignant (primary) neoplasm, unspecified: C80.1

## 2022-01-14 LAB — BASIC METABOLIC PANEL
Anion gap: 7 (ref 5–15)
BUN: 19 mg/dL (ref 8–23)
CO2: 27 mmol/L (ref 22–32)
Calcium: 8.9 mg/dL (ref 8.9–10.3)
Chloride: 104 mmol/L (ref 98–111)
Creatinine, Ser: 1.24 mg/dL (ref 0.61–1.24)
GFR, Estimated: >60 mL/min (ref >60)
Glucose, Bld: 99 mg/dL (ref 70–99)
Potassium: 4.4 mmol/L (ref 3.5–5.1)
Sodium: 138 mmol/L (ref 135–145)

## 2022-01-14 LAB — CBC
HCT: 44.4 % (ref 39.0–52.0)
Hemoglobin: 14.8 g/dL (ref 13.0–17.0)
MCH: 30.2 pg (ref 26.0–34.0)
MCHC: 33.3 g/dL (ref 30.0–36.0)
MCV: 90.6 fL (ref 80.0–100.0)
Platelets: 245 10*3/uL (ref 150–400)
RBC: 4.9 MIL/uL (ref 4.22–5.81)
RDW: 12.9 % (ref 11.5–15.5)
WBC: 9 10*3/uL (ref 4.0–10.5)
nRBC: 0 % (ref 0.0–0.2)

## 2022-01-14 LAB — SURGICAL PCR SCREEN
MRSA, PCR: NEGATIVE
Staphylococcus aureus: NEGATIVE

## 2022-01-14 NOTE — Patient Instructions (Addendum)
SURGICAL WAITING ROOM VISITATION  Patients having surgery or a procedure may have no more than 2 support people in the waiting area - these visitors may rotate.    Children under the age of 41 must have an adult with them who is not the patient.  Due to an increase in RSV and influenza rates and associated hospitalizations, children ages 38 and under may not visit patients in [place].  If the patient needs to stay at the hospital during part of their recovery, the visitor guidelines for inpatient rooms apply. Pre-op nurse will coordinate an appropriate time for 1 support person to accompany patient in pre-op.  This support person may not rotate.    Please refer to the [facility] website for the visitor guidelines for Inpatients (after your surgery is over and you are in a regular room).    Your procedure is scheduled on: 01/20/22   Report to [facility] Main Entrance    Report to admitting at 5:15 AM   Call this number if you have problems the morning of surgery [phone]   Do not eat food :After Midnight.   After Midnight you may have the following liquids until 4:15 AM DAY OF SURGERY  Water Non-Citrus Juices (without pulp, NO RED-Apple, White grape, White cranberry) Black Coffee (NO MILK/CREAM OR CREAMERS, sugar ok)  Clear Tea (NO MILK/CREAM OR CREAMERS, sugar ok) regular and decaf                             Plain Jell-O (NO RED)                                           Fruit ices (not with fruit pulp, NO RED)                                     Popsicles (NO RED)                                                               Sports drinks like Gatorade (NO RED)               The day of surgery:  Drink ONE (1) Pre-Surgery Clear Ensure at 4:15 AM the morning of surgery. Drink in one sitting. Do not sip.  This drink was given to you during your hospital  pre-op appointment visit. Nothing else to drink after completing the  Pre-Surgery Clear Ensure.           If you have questions, please contact your surgeon's office.   FOLLOW BOWEL PREP AND ANY ADDITIONAL PRE OP INSTRUCTIONS YOU RECEIVED FROM YOUR SURGEON'S OFFICE!!!     Oral Hygiene is also important to reduce your risk of infection.                                    Remember - BRUSH YOUR TEETH THE MORNING OF SURGERY WITH YOUR REGULAR TOOTHPASTE  DENTURES WILL BE REMOVED  PRIOR TO SURGERY PLEASE DO NOT APPLY "Poly grip" OR ADHESIVES!!!   Take these medicines the morning of surgery with A SIP OF WATER: Buspirone, Finasteride, Pantoprazole, Propranolol              You may not have any metal on your body including jewelry, and body piercing             Do not wear lotions, powders, cologne, or deodorant              Men may shave face and neck.   Do not bring valuables to the hospital. [place].   Contacts, glasses, dentures or bridgework may not be worn into surgery.  DO NOT [place]. PHARMACY WILL DISPENSE MEDICATIONS LISTED ON YOUR MEDICATION LIST TO YOU DURING YOUR ADMISSION [place]!    Patients discharged on the day of surgery will not be allowed to drive home.  Someone NEEDS to stay with you for the first 24 hours after anesthesia.   Special Instructions: Bring a copy of your healthcare power of attorney and living will documents the day of surgery if you haven't scanned them before.              Please read over the following fact sheets you were given: IF [place] [phone] Schneiders    If you received a COVID test during your pre-op visit  it is requested that you wear a mask when out in public, stay away from anyone that may not be feeling well and notify your surgeon if you develop symptoms. If you test positive for Covid or have been in contact with anyone that has tested positive in the last 10 days please notify you  surgeon.    Page - Preparing for Surgery Before surgery, you can play an important role.  Because skin is not sterile, your skin needs to be as free of germs as possible.  You can reduce the number of germs on your skin by washing with CHG (chlorahexidine gluconate) soap before surgery.  CHG is an antiseptic cleaner which kills germs and bonds with the skin to continue killing germs even after washing. Please DO NOT use if you have an allergy to CHG or antibacterial soaps.  If your skin becomes reddened/irritated stop using the CHG and inform your nurse when you arrive at Short Stay. Do not shave (including legs and underarms) for at least 48 hours prior to the first CHG shower.  You may shave your face/neck.  Please follow these instructions carefully:  1.  Shower with CHG Soap the night before surgery and the  morning of surgery.  2.  If you choose to wash your hair, wash your hair first as usual with your normal  shampoo.  3.  After you shampoo, rinse your hair and body thoroughly to remove the shampoo.                             4.  Use CHG as you would any other liquid soap.  You can apply chg directly to the skin and wash.  Gently with a scrungie or clean washcloth.  5.  Apply the CHG Soap to your body ONLY FROM THE NECK DOWN.   Do  not use on face/ open                           Wound or open sores. Avoid contact with eyes, ears mouth and   genitals (private parts).                       Wash face,  Genitals (private parts) with your normal soap.             6.  Wash thoroughly, paying special attention to the area where your    surgery  will be performed.  7.  Thoroughly rinse your body with warm water from the neck down.  8.  DO NOT shower/wash with your normal soap after using and rinsing off the CHG Soap.                9.  Pat yourself dry with a clean towel.            10.  Wear clean pajamas.            11.  Place clean sheets on your bed the night of your first shower and  do not  sleep with pets. Day of Surgery : Do not apply any lotions/deodorants the morning of surgery.  Please wear clean clothes to the hospital/surgery center.  FAILURE TO FOLLOW THESE INSTRUCTIONS MAY RESULT IN THE CANCELLATION OF YOUR SURGERY  PATIENT SIGNATURE_________________________________  NURSE SIGNATURE__________________________________  ________________________________________________________________________  Adam Phenix  An incentive spirometer is a tool that can help keep your lungs clear and active. This tool measures how well you are filling your lungs with each breath. Taking long deep breaths may help reverse or decrease the chance of developing breathing (pulmonary) problems (especially infection) following: A long period of time when you are unable to move or be active. BEFORE THE PROCEDURE  If the spirometer includes an indicator to show your best effort, your nurse or respiratory therapist will set it to a desired goal. If possible, sit up straight or lean slightly forward. Try not to slouch. Hold the incentive spirometer in an upright position. INSTRUCTIONS FOR USE  Sit on the edge of your bed if possible, or sit up as far as you can in bed or on a chair. Hold the incentive spirometer in an upright position. Breathe out normally. Place the mouthpiece in your mouth and seal your lips tightly around it. Breathe in slowly and as deeply as possible, raising the piston or the ball toward the top of the column. Hold your breath for 3-5 seconds or for as long as possible. Allow the piston or ball to fall to the bottom of the column. Remove the mouthpiece from your mouth and breathe out normally. Rest for a few seconds and repeat Steps 1 through 7 at least 10 times every 1-2 hours when you are awake. Take your time and take a few normal breaths between deep breaths. The spirometer may include an indicator to show your best effort. Use the indicator as a goal to  work toward during each repetition. After each set of 10 deep breaths, practice coughing to be sure your lungs are clear. If you have an incision (the cut made at the time of surgery), support your incision when coughing by placing a pillow or rolled up towels firmly against it. Once you are able to get out of bed, walk around indoors and cough well. You may stop  using the incentive spirometer when instructed by your caregiver.  RISKS AND COMPLICATIONS Take your time so you do not get dizzy or light-headed. If you are in pain, you may need to take or ask for pain medication before doing incentive spirometry. It is harder to take a deep breath if you are having pain. AFTER USE Rest and breathe slowly and easily. It can be helpful to keep track of a log of your progress. Your caregiver can provide you with a simple table to help with this. If you are using the spirometer at home, follow these instructions: [place] IF:  You are having difficultly using the spirometer. You have trouble using the spirometer as often as instructed. Your pain medication is not giving enough relief while using the spirometer. You develop fever of 100.5 F (38.1 C) or higher. SEEK IMMEDIATE MEDICAL CARE IF:  You cough up bloody sputum that had not been present before. You develop fever of 102 F (38.9 C) or greater. You develop worsening pain at or near the incision site. MAKE SURE YOU:  Understand these instructions. Will watch your condition. Will get help right away if you are not doing well or get worse. Document Released: 05/11/2006 Document Revised: 03/23/2011 Document Reviewed: 07/12/2006 [facility] Patient Information 2014 [place], [place].   ________________________________________________________________________

## 2022-01-15 NOTE — Progress Notes (Signed)
Anesthesia Chart Review   Case: [id number] Date/Time: 01/20/22 0700   Procedure: UNICOMPARTMENTAL KNEE MEDIALLY (Right: Knee)   Anesthesia type: Spinal   Pre-op diagnosis: Right knee osteoarthritis medially   Location: [facility] ROOM 09 / [facility]   Surgeons: Paralee Cancel, MD       DISCUSSION:67 y.o. never smoker with h/o MVP, right knee OA scheduled for above procedure 01/20/2022 with Dr. Paralee Cancel.   Pt last seen by cardiology 11/19/2021. Per OV note, "Chart reviewed as part of pre-operative protocol coverage. Given past medical history and time since last visit, based on ACC/AHA guidelines, ZAELYN BARBARY would be at acceptable risk for the planned procedure without further cardiovascular testing.    Patient was advised that if he develops new symptoms prior to surgery to contact our office to arrange a follow-up appointment.  She verbalized understanding.   His RCRI is a class I risk, 0.4% risk of major cardiac event.  He is able to complete greater than 4 METS of physical activity."  Anticipate pt can proceed with planned procedure barring acute status change.   VS: BP 139/67   Pulse (!) 51 Comment: pt reports normal for him  Temp 36.6 C (Oral)   Resp 14   Ht '5\' 10"'$  (1.778 m)   Wt 87.1 kg   SpO2 96%   BMI 27.55 kg/m   PROVIDERS: Kathyrn Lass, MD is PCP   Cardiologist - Cleatrice Burke, MD  LABS: Labs reviewed: Acceptable for surgery. (all labs ordered are listed, but only abnormal results are displayed)  Labs Reviewed  SURGICAL PCR SCREEN  BASIC METABOLIC PANEL  CBC     IMAGES:   EKG:   CV: Echo 05/08/2008 Normal LV function.  Minimal prolapse of the posterior leaflet of the mitral valve. Mild mitral regurgitation trace tricuspid regurgitation.  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     Past Surgical History:  Procedure Laterality Date   COLONOSCOPY     DRUG INDUCED ENDOSCOPY     KNEE ARTHROSCOPY Right    US ECHOCARDIOGRAPHY  05/08/2008   EF 55-60%   US ECHOCARDIOGRAPHY  08/23/2002   EF 60-65%    MEDICATIONS:  atorvastatin (LIPITOR) 20 MG tablet   busPIRone (BUSPAR) 30 MG tablet   dicyclomine (BENTYL) 10 MG capsule   finasteride (PROSCAR) 5 MG tablet   gabapentin (NEURONTIN) 300 MG capsule   pantoprazole (PROTONIX) 40 MG tablet   propranolol (INDERAL) 20 MG tablet   sildenafil (VIAGRA) 100 MG tablet   Varenicline Tartrate (TYRVAYA NA)   No current facility-administered medications for this encounter.    Konrad Felix Ward, PA-C [facility]Surgical [facility] [phone]

## 2022-01-19 NOTE — H&P (Signed)
Kevin Reyes, 67 y.o. male, Kevin Reyes, Kevin Reyes, Kevin activity modification.  Onset of symptoms was gradual, starting 2 years ago with gradually worsening course since that time. The patient noted no past surgery on the right knee(s).  Patient currently rates pain in the right knee(s) at 8 out of 10 with activity. Patient Kevin worsening of pain with activity Kevin weight bearing, pain that interferes with activities of daily living, Kevin pain with passive range of motion.  Patient Kevin evidence of joint space narrowing by imaging studies. There is no active infection.  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     Past Surgical History:  Procedure Laterality Date   COLONOSCOPY     DRUG INDUCED ENDOSCOPY     KNEE ARTHROSCOPY Right    US ECHOCARDIOGRAPHY  05/08/2008   EF 55-60%   US ECHOCARDIOGRAPHY  08/23/2002   EF 60-65%    No current facility-administered medications for this encounter.   Current Outpatient Medications  Medication Sig Dispense Refill Last Dose    atorvastatin (LIPITOR) 20 MG tablet TAKE 1 TABLET BY MOUTH DAILY AT 6 PM. PLEASE MAKE OVERDUE APPT BEFORE ANYMORE REFILLS 90 tablet 1    busPIRone (BUSPAR) 30 MG tablet Take 10 mg by mouth in the morning Kevin at bedtime.      finasteride (PROSCAR) 5 MG tablet Take 5 mg by mouth daily.  3    gabapentin (NEURONTIN) 300 MG capsule Take 600 mg by mouth at bedtime.      pantoprazole (PROTONIX) 40 MG tablet Take 1 tablet (40 mg Kevin) by mouth 2 (two) times daily. 90 tablet 3    propranolol (INDERAL) 20 MG tablet Take 20 mg by mouth 3 (three) times daily.      Varenicline Tartrate (TYRVAYA NA) Place 1 drop into both eyes in the morning Kevin at bedtime.      dicyclomine (BENTYL) 10 MG capsule Take 1 capsule (10 mg Kevin) by mouth 4 (four) times daily -  before meals Kevin at bedtime. (Patient not taking: Reported on 01/09/2022) 120 capsule 3 Not Taking   sildenafil (VIAGRA) 100 MG tablet 1 tablet as needed      No Known Allergies  Social History   Tobacco Use   Smoking status: Never   Smokeless tobacco: Never  Substance Use Topics   Alcohol use: Yes    Alcohol/week: 4.0 standard drinks of alcohol    Types: 4 Shots of liquor per week    Comment: social use  Family History  Problem Relation Age of Onset   Hypertension Mother    Sudden death Neg Hx    Hyperlipidemia Neg Hx    Heart attack Neg Hx    Diabetes Neg Hx      Review of Systems  Constitutional:  Negative for chills Kevin fever.  Respiratory:  Negative for cough Kevin shortness of breath.   Cardiovascular:  Negative for chest pain.  Gastrointestinal:  Negative for nausea Kevin vomiting.  Musculoskeletal:  Positive for arthralgias.     Objective:  Physical Exam Well nourished Kevin well developed. General: Alert Kevin oriented x3, cooperative Kevin pleasant, no acute distress. Head: normocephalic, atraumatic, neck supple. Eyes: EOMI.  Musculoskeletal: Right knee exam: No palpable effusion, warmth erythema Full knee extension Kevin  flexion of 120 degrees Tenderness medially Stable Kevin intact medial, lateral Kevin anterior cruciate ligaments  Left knee exam: Full knee extension Kevin flexion Mild tenderness medially No palpable effusion  Calves soft Kevin nontender. Motor function intact in LE. Strength 5/5 LE bilaterally. Neuro: Distal pulses 2+. Sensation to light touch intact in LE.  Vital signs in last 24 hours:    Labs:   Estimated body mass index is 27.55 kg/m as calculated from the following:   Height as of 01/14/22: '5\' 10"'$  (1.778 m).   Weight as of 01/14/22: 87.1 kg.   Imaging Review Plain radiographs demonstrate severe degenerative joint disease of the right knee(s). The overall alignment isneutral. The bone quality appears to be adequate for age Kevin reported activity level.      Assessment/Plan:  End stage arthritis, right knee   The patient history, physical examination, clinical judgment of the provider Kevin imaging studies are consistent with end stage degenerative joint disease of the right knee(s) Kevin partial knee arthroplasty is deemed medically necessary. The treatment options including medical management, injection therapy arthroscopy Kevin arthroplasty were discussed at length. The risks Kevin benefits of partial knee arthroplasty were presented Kevin reviewed. The risks due to aseptic loosening, infection, stiffness, patella tracking problems, thromboembolic complications Kevin other imponderables were discussed. The patient acknowledged the explanation, agreed to proceed with the plan Kevin consent was signed. Patient is being admitted for inpatient treatment for surgery, pain control, PT, OT, prophylactic antibiotics, VTE prophylaxis, progressive ambulation Kevin ADL's Kevin discharge planning. The patient is planning to be discharged  home  Therapy Plans: outpatient [facility] at [facility] PT Disposition: Home with wife Planned DVT Prophylaxis: aspirin '81mg'$  BID DME needed: walker PCP: Dr. Sabra Heck, Cardio: Dr.  Acie Fredrickson - clearance received TXA: IV Allergies: NKDA Anesthesia Concerns: headache after endoscopy BMI: 26.8 Last HgbA1c: Not diabetic   Other: - staying night vs SDD ? 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

## 2022-01-19 NOTE — Anesthesia Preprocedure Evaluation (Signed)
Anesthesia Evaluation  Patient identified by MRN, date of birth, ID band Patient awake    Reviewed: Allergy & Precautions, NPO status , Patient's Chart, lab work & pertinent test results, reviewed documented beta blocker date and time   Airway Mallampati: II  TM Distance: >3 FB Neck ROM: Full    Dental  (+) Teeth Intact, Dental Advisory Given   Pulmonary neg pulmonary ROS   Pulmonary exam normal breath sounds clear to auscultation       Cardiovascular Normal cardiovascular exam+ Valvular Problems/Murmurs MVP and MR  Rhythm:Regular Rate:Normal     Neuro/Psych  Headaches PSYCHIATRIC DISORDERS Anxiety        GI/Hepatic Neg liver ROS,GERD  Medicated,,  Endo/Other  negative endocrine ROS    Renal/GU negative Renal ROS     Musculoskeletal  (+) Arthritis , Osteoarthritis,    Abdominal   Peds  Hematology Plt 245k   Anesthesia Other Findings   Reproductive/Obstetrics                             Anesthesia Physical Anesthesia Plan  ASA: 2  Anesthesia Plan: Spinal   Post-op Pain Management: Regional block* and Tylenol PO (pre-op)*   Induction: Intravenous  PONV Risk Score and Plan: 1 and TIVA, Treatment may vary due to age or medical condition, Midazolam, Dexamethasone and Ondansetron  Airway Management Planned: Natural Airway and Simple Face Mask  Additional Equipment:   Intra-op Plan:   Post-operative Plan:   Informed Consent: I have reviewed the patients History and Physical, chart, labs and discussed the procedure including the risks, benefits and alternatives for the proposed anesthesia with the patient or authorized representative who has indicated his/her understanding and acceptance.     Dental advisory given  Plan Discussed with: CRNA, Anesthesiologist and Surgeon  Anesthesia Plan Comments: (Discussed risks and benefits of and differences between spinal and general. Discussed  risks of spinal including headache, backache, failure, bleeding, infection, and nerve damage. Patient consents to spinal. Questions answered. Coagulation studies and platelet count acceptable.)       Anesthesia Quick Evaluation

## 2022-01-20 ENCOUNTER — Ambulatory Visit (HOSPITAL_COMMUNITY): Payer: BC Managed Care – PPO | Admitting: Physician Assistant

## 2022-01-20 ENCOUNTER — Other Ambulatory Visit: Payer: Self-pay

## 2022-01-20 ENCOUNTER — Observation Stay (HOSPITAL_COMMUNITY)
Admission: RE | Admit: 2022-01-20 | Discharge: 2022-01-21 | Disposition: A | Discharge status: Home / Self Care | Payer: BC Managed Care – PPO | Attending: Orthopedic Surgery | Admitting: Orthopedic Surgery

## 2022-01-20 ENCOUNTER — Encounter (HOSPITAL_COMMUNITY)
Admission: RE | Disposition: A | Discharge status: Home / Self Care | Payer: Self-pay | Source: Home / Self Care | Attending: Orthopedic Surgery

## 2022-01-20 ENCOUNTER — Encounter (HOSPITAL_COMMUNITY): Payer: Self-pay | Admitting: Orthopedic Surgery

## 2022-01-20 ENCOUNTER — Ambulatory Visit (HOSPITAL_COMMUNITY): Payer: BC Managed Care – PPO | Admitting: Certified Registered Nurse Anesthetist

## 2022-01-20 DIAGNOSIS — Z79899 Other long term (current) drug therapy: Secondary | ICD-10-CM | POA: Insufficient documentation

## 2022-01-20 DIAGNOSIS — Z85828 Personal history of other malignant neoplasm of skin: Secondary | ICD-10-CM | POA: Insufficient documentation

## 2022-01-20 DIAGNOSIS — G8918 Other acute postprocedural pain: Secondary | ICD-10-CM | POA: Diagnosis not present

## 2022-01-20 DIAGNOSIS — M1711 Unilateral primary osteoarthritis, right knee: Principal | ICD-10-CM | POA: Insufficient documentation

## 2022-01-20 DIAGNOSIS — Z96651 Presence of right artificial knee joint: Secondary | ICD-10-CM

## 2022-01-20 HISTORY — PX: PARTIAL KNEE ARTHROPLASTY: SHX2174

## 2022-01-20 SURGERY — ARTHROPLASTY, KNEE, UNICOMPARTMENTAL
Anesthesia: Spinal | Site: Knee | Laterality: Right

## 2022-01-20 MED ORDER — 0.9 % SODIUM CHLORIDE (POUR BTL) OPTIME
TOPICAL | Status: DC | PRN
  Administered 2022-01-20: 1000 mL

## 2022-01-20 MED ORDER — KETOROLAC TROMETHAMINE 30 MG/ML IJ SOLN
INTRAMUSCULAR | Status: DC | PRN
  Administered 2022-01-20: 30 mg via INTRAMUSCULAR

## 2022-01-20 MED ORDER — FINASTERIDE 5 MG PO TABS
5.0000 mg | ORAL_TABLET | Freq: Every day | ORAL | Status: DC
  Administered 2022-01-20 – 2022-01-21 (×2): 5 mg via ORAL
  Filled 2022-01-20 (×2): qty 1

## 2022-01-20 MED ORDER — EPHEDRINE SULFATE-NACL 50-0.9 MG/10ML-% IV SOSY
PREFILLED_SYRINGE | INTRAVENOUS | Status: DC | PRN
  Administered 2022-01-20: 10 mg via INTRAVENOUS
  Administered 2022-01-20 (×2): 15 mg via INTRAVENOUS
  Administered 2022-01-20: 5 mg via INTRAVENOUS

## 2022-01-20 MED ORDER — TRANEXAMIC ACID-NACL 1000-0.7 MG/100ML-% IV SOLN
1000.0000 mg | INTRAVENOUS | Status: AC
  Administered 2022-01-20: 1000 mg via INTRAVENOUS
  Filled 2022-01-20: qty 100

## 2022-01-20 MED ORDER — FENTANYL CITRATE PF 50 MCG/ML IJ SOSY: 25.0000 ug | PREFILLED_SYRINGE | INTRAMUSCULAR | Status: DC | PRN

## 2022-01-20 MED ORDER — BUPIVACAINE-EPINEPHRINE (PF) 0.5% -1:200000 IJ SOLN
INTRAMUSCULAR | Status: DC | PRN
  Administered 2022-01-20: 30 mL

## 2022-01-20 MED ORDER — DOCUSATE SODIUM 100 MG PO CAPS
100.0000 mg | ORAL_CAPSULE | Freq: Two times a day (BID) | ORAL | Status: DC
  Administered 2022-01-20 – 2022-01-21 (×2): 100 mg via ORAL
  Filled 2022-01-20 (×2): qty 1

## 2022-01-20 MED ORDER — FENTANYL CITRATE (PF) 100 MCG/2ML IJ SOLN
INTRAMUSCULAR | Status: DC | PRN
  Administered 2022-01-20: 25 ug via INTRAVENOUS

## 2022-01-20 MED ORDER — METHOCARBAMOL 500 MG PO TABS
500.0000 mg | ORAL_TABLET | Freq: Four times a day (QID) | ORAL | Status: DC | PRN
  Administered 2022-01-20: 500 mg via ORAL
  Filled 2022-01-20 (×2): qty 1

## 2022-01-20 MED ORDER — HYDROMORPHONE HCL 1 MG/ML IJ SOLN: 0.5000 mg | INTRAMUSCULAR | Status: DC | PRN

## 2022-01-20 MED ORDER — ACETAMINOPHEN 500 MG PO TABS
1000.0000 mg | ORAL_TABLET | Freq: Four times a day (QID) | ORAL | Status: DC
  Administered 2022-01-20 (×2): 1000 mg via ORAL
  Filled 2022-01-20 (×2): qty 2

## 2022-01-20 MED ORDER — TRANEXAMIC ACID 650 MG PO TABS: 1950.0000 mg | ORAL_TABLET | Freq: Every day | ORAL | 0 refills | Status: AC

## 2022-01-20 MED ORDER — DEXAMETHASONE SODIUM PHOSPHATE 10 MG/ML IJ SOLN
8.0000 mg | Freq: Once | INTRAMUSCULAR | Status: AC
  Administered 2022-01-20: 4 mg via INTRAVENOUS

## 2022-01-20 MED ORDER — METHOCARBAMOL 500 MG PO TABS: 500.0000 mg | ORAL_TABLET | Freq: Four times a day (QID) | ORAL | 1 refills | Status: DC | PRN

## 2022-01-20 MED ORDER — BUSPIRONE HCL 10 MG PO TABS
10.0000 mg | ORAL_TABLET | Freq: Two times a day (BID) | ORAL | Status: DC
  Administered 2022-01-20 – 2022-01-21 (×2): 10 mg via ORAL
  Filled 2022-01-20 (×2): qty 1

## 2022-01-20 MED ORDER — GABAPENTIN 300 MG PO CAPS
600.0000 mg | ORAL_CAPSULE | Freq: Every day | ORAL | Status: DC
  Administered 2022-01-20: 600 mg via ORAL
  Filled 2022-01-20: qty 2

## 2022-01-20 MED ORDER — METOCLOPRAMIDE HCL 5 MG/ML IJ SOLN: 5.0000 mg | Freq: Three times a day (TID) | INTRAMUSCULAR | Status: DC | PRN

## 2022-01-20 MED ORDER — ONDANSETRON HCL 4 MG/2ML IJ SOLN
INTRAMUSCULAR | Status: DC | PRN
  Administered 2022-01-20: 4 mg via INTRAVENOUS

## 2022-01-20 MED ORDER — TRANEXAMIC ACID-NACL 1000-0.7 MG/100ML-% IV SOLN
INTRAVENOUS | Status: AC
  Filled 2022-01-20: qty 100

## 2022-01-20 MED ORDER — OXYCODONE HCL 5 MG PO TABS: 5.0000 mg | ORAL_TABLET | ORAL | 0 refills | Status: DC | PRN

## 2022-01-20 MED ORDER — METHOCARBAMOL 500 MG IVPB - SIMPLE MED
INTRAVENOUS | Status: AC
  Filled 2022-01-20: qty 55

## 2022-01-20 MED ORDER — SODIUM CHLORIDE (PF) 0.9 % IJ SOLN
INTRAMUSCULAR | Status: AC
  Filled 2022-01-20: qty 30

## 2022-01-20 MED ORDER — LACTATED RINGERS IV SOLN: INTRAVENOUS | Status: DC

## 2022-01-20 MED ORDER — POLYETHYLENE GLYCOL 3350 17 G PO PACK: 17.0000 g | PACK | Freq: Two times a day (BID) | ORAL | 0 refills | Status: DC

## 2022-01-20 MED ORDER — CEFAZOLIN SODIUM-DEXTROSE 2-4 GM/100ML-% IV SOLN
INTRAVENOUS | Status: AC
  Filled 2022-01-20: qty 100

## 2022-01-20 MED ORDER — FENTANYL CITRATE (PF) 100 MCG/2ML IJ SOLN
INTRAMUSCULAR | Status: AC
  Filled 2022-01-20: qty 2

## 2022-01-20 MED ORDER — DEXAMETHASONE SODIUM PHOSPHATE 10 MG/ML IJ SOLN
INTRAMUSCULAR | Status: DC | PRN
  Administered 2022-01-20: 10 mg

## 2022-01-20 MED ORDER — LACTATED RINGERS IV BOLUS
250.0000 mL | Freq: Once | INTRAVENOUS | Status: AC
  Administered 2022-01-20: 250 mL via INTRAVENOUS

## 2022-01-20 MED ORDER — ROPIVACAINE HCL 5 MG/ML IJ SOLN
INTRAMUSCULAR | Status: DC | PRN
  Administered 2022-01-20: 20 mL via PERINEURAL

## 2022-01-20 MED ORDER — KETOROLAC TROMETHAMINE 30 MG/ML IJ SOLN
INTRAMUSCULAR | Status: AC
  Filled 2022-01-20: qty 1

## 2022-01-20 MED ORDER — CHLORHEXIDINE GLUCONATE 0.12 % MT SOLN
15.0000 mL | Freq: Once | OROMUCOSAL | Status: AC
  Administered 2022-01-20: 15 mL via OROMUCOSAL

## 2022-01-20 MED ORDER — PHENOL 1.4 % MT LIQD: 1.0000 | OROMUCOSAL | Status: DC | PRN

## 2022-01-20 MED ORDER — MENTHOL 3 MG MT LOZG: 1.0000 | LOZENGE | OROMUCOSAL | Status: DC | PRN

## 2022-01-20 MED ORDER — LIDOCAINE HCL (PF) 2 % IJ SOLN
INTRAMUSCULAR | Status: AC
  Filled 2022-01-20: qty 5

## 2022-01-20 MED ORDER — LACTATED RINGERS IV BOLUS
500.0000 mL | Freq: Once | INTRAVENOUS | Status: AC
  Administered 2022-01-20: 500 mL via INTRAVENOUS

## 2022-01-20 MED ORDER — OXYCODONE HCL 5 MG PO TABS: 10.0000 mg | ORAL_TABLET | ORAL | Status: DC | PRN

## 2022-01-20 MED ORDER — ASPIRIN 81 MG PO CHEW
81.0000 mg | CHEWABLE_TABLET | Freq: Two times a day (BID) | ORAL | Status: DC
  Administered 2022-01-21: 81 mg via ORAL
  Filled 2022-01-20: qty 1

## 2022-01-20 MED ORDER — BISACODYL 10 MG RE SUPP: 10.0000 mg | Freq: Every day | RECTAL | Status: DC | PRN

## 2022-01-20 MED ORDER — DIPHENHYDRAMINE HCL 12.5 MG/5ML PO ELIX: 12.5000 mg | ORAL_SOLUTION | ORAL | Status: DC | PRN

## 2022-01-20 MED ORDER — PHENYLEPHRINE HCL-NACL 20-0.9 MG/250ML-% IV SOLN
INTRAVENOUS | Status: AC
  Filled 2022-01-20: qty 250

## 2022-01-20 MED ORDER — MIDAZOLAM HCL 2 MG/2ML IJ SOLN
INTRAMUSCULAR | Status: AC
  Filled 2022-01-20: qty 2

## 2022-01-20 MED ORDER — POLYETHYLENE GLYCOL 3350 17 G PO PACK
17.0000 g | PACK | Freq: Two times a day (BID) | ORAL | Status: DC
  Administered 2022-01-20: 17 g via ORAL
  Filled 2022-01-20: qty 1

## 2022-01-20 MED ORDER — SENNA 8.6 MG PO TABS: 1.0000 | ORAL_TABLET | Freq: Every day | ORAL | 0 refills | Status: AC

## 2022-01-20 MED ORDER — ATORVASTATIN CALCIUM 20 MG PO TABS
20.0000 mg | ORAL_TABLET | Freq: Every day | ORAL | Status: DC
  Administered 2022-01-20 – 2022-01-21 (×2): 20 mg via ORAL
  Filled 2022-01-20 (×2): qty 1

## 2022-01-20 MED ORDER — OXYCODONE HCL 5 MG PO TABS
5.0000 mg | ORAL_TABLET | ORAL | Status: DC | PRN
  Administered 2022-01-20 – 2022-01-21 (×2): 10 mg via ORAL
  Filled 2022-01-20 (×2): qty 2

## 2022-01-20 MED ORDER — PROPRANOLOL HCL 20 MG PO TABS
20.0000 mg | ORAL_TABLET | Freq: Three times a day (TID) | ORAL | Status: DC
  Administered 2022-01-20 – 2022-01-21 (×3): 20 mg via ORAL
  Filled 2022-01-20 (×3): qty 1

## 2022-01-20 MED ORDER — CEFAZOLIN SODIUM-DEXTROSE 2-4 GM/100ML-% IV SOLN
2.0000 g | INTRAVENOUS | Status: AC
  Administered 2022-01-20: 2 g via INTRAVENOUS
  Filled 2022-01-20: qty 100

## 2022-01-20 MED ORDER — CEFAZOLIN SODIUM-DEXTROSE 2-4 GM/100ML-% IV SOLN
2.0000 g | Freq: Four times a day (QID) | INTRAVENOUS | Status: AC
  Administered 2022-01-20 (×2): 2 g via INTRAVENOUS
  Filled 2022-01-20: qty 100

## 2022-01-20 MED ORDER — ACETAMINOPHEN 500 MG PO TABS
ORAL_TABLET | ORAL | Status: AC
  Filled 2022-01-20: qty 2

## 2022-01-20 MED ORDER — DEXAMETHASONE SODIUM PHOSPHATE 10 MG/ML IJ SOLN
INTRAMUSCULAR | Status: AC
  Filled 2022-01-20: qty 1

## 2022-01-20 MED ORDER — ACETAMINOPHEN 500 MG PO TABS
1000.0000 mg | ORAL_TABLET | Freq: Once | ORAL | Status: AC
  Administered 2022-01-20: 1000 mg via ORAL
  Filled 2022-01-20: qty 2

## 2022-01-20 MED ORDER — ONDANSETRON HCL 4 MG PO TABS: 4.0000 mg | ORAL_TABLET | Freq: Four times a day (QID) | ORAL | Status: DC | PRN

## 2022-01-20 MED ORDER — PROPOFOL 10 MG/ML IV BOLUS
INTRAVENOUS | Status: DC | PRN
  Administered 2022-01-20: 20 mg via INTRAVENOUS

## 2022-01-20 MED ORDER — METOCLOPRAMIDE HCL 5 MG PO TABS: 5.0000 mg | ORAL_TABLET | Freq: Three times a day (TID) | ORAL | Status: DC | PRN

## 2022-01-20 MED ORDER — METHOCARBAMOL 500 MG IVPB - SIMPLE MED
500.0000 mg | Freq: Four times a day (QID) | INTRAVENOUS | Status: DC | PRN
  Administered 2022-01-20: 500 mg via INTRAVENOUS

## 2022-01-20 MED ORDER — SODIUM CHLORIDE 0.9 % IV SOLN: INTRAVENOUS | Status: DC

## 2022-01-20 MED ORDER — POVIDONE-IODINE 10 % EX SWAB
2.0000 | Freq: Once | CUTANEOUS | Status: AC
  Administered 2022-01-20: 2 via TOPICAL

## 2022-01-20 MED ORDER — ONDANSETRON HCL 4 MG/2ML IJ SOLN: 4.0000 mg | Freq: Four times a day (QID) | INTRAMUSCULAR | Status: DC | PRN

## 2022-01-20 MED ORDER — BUPIVACAINE IN DEXTROSE 0.75-8.25 % IT SOLN
INTRATHECAL | Status: DC | PRN
  Administered 2022-01-20: 1.6 mL via INTRATHECAL

## 2022-01-20 MED ORDER — DEXAMETHASONE SODIUM PHOSPHATE 10 MG/ML IJ SOLN
10.0000 mg | Freq: Once | INTRAMUSCULAR | Status: AC
  Administered 2022-01-21: 10 mg via INTRAVENOUS
  Filled 2022-01-20: qty 1

## 2022-01-20 MED ORDER — PROPOFOL 500 MG/50ML IV EMUL
INTRAVENOUS | Status: DC | PRN
  Administered 2022-01-20: 50 ug/kg/min via INTRAVENOUS

## 2022-01-20 MED ORDER — PROMETHAZINE HCL 25 MG/ML IJ SOLN: 6.2500 mg | INTRAMUSCULAR | Status: DC | PRN

## 2022-01-20 MED ORDER — ASPIRIN 81 MG PO CHEW: 81.0000 mg | CHEWABLE_TABLET | Freq: Two times a day (BID) | ORAL | 0 refills | Status: AC

## 2022-01-20 MED ORDER — TRANEXAMIC ACID-NACL 1000-0.7 MG/100ML-% IV SOLN
1000.0000 mg | Freq: Once | INTRAVENOUS | Status: AC
  Administered 2022-01-20: 1000 mg via INTRAVENOUS

## 2022-01-20 MED ORDER — ONDANSETRON HCL 4 MG/2ML IJ SOLN
INTRAMUSCULAR | Status: AC
  Filled 2022-01-20: qty 2

## 2022-01-20 MED ORDER — BUPIVACAINE-EPINEPHRINE (PF) 0.5% -1:200000 IJ SOLN
INTRAMUSCULAR | Status: AC
  Filled 2022-01-20: qty 30

## 2022-01-20 MED ORDER — ORAL CARE MOUTH RINSE: 15.0000 mL | Freq: Once | OROMUCOSAL | Status: AC

## 2022-01-20 MED ORDER — LACTATED RINGERS IV BOLUS: 250.0000 mL | Freq: Once | INTRAVENOUS | Status: DC

## 2022-01-20 MED ORDER — MIDAZOLAM HCL 5 MG/5ML IJ SOLN
INTRAMUSCULAR | Status: DC | PRN
  Administered 2022-01-20: 2 mg via INTRAVENOUS

## 2022-01-20 MED ORDER — PROPOFOL 1000 MG/100ML IV EMUL
INTRAVENOUS | Status: AC
  Filled 2022-01-20: qty 100

## 2022-01-20 MED ORDER — SODIUM CHLORIDE (PF) 0.9 % IJ SOLN
INTRAMUSCULAR | Status: DC | PRN
  Administered 2022-01-20: 30 mL

## 2022-01-20 MED ORDER — PANTOPRAZOLE SODIUM 40 MG PO TBEC
40.0000 mg | DELAYED_RELEASE_TABLET | Freq: Two times a day (BID) | ORAL | Status: DC
  Administered 2022-01-20 – 2022-01-21 (×2): 40 mg via ORAL
  Filled 2022-01-20 (×2): qty 1

## 2022-01-20 SURGICAL SUPPLY — 49 items
BAG COUNTER SPONGE SURGICOUNT (BAG) IMPLANT
BAG ZIPLOCK 12X15 (MISCELLANEOUS) IMPLANT
BLADE SAW RECIPROCATING 77.5 (BLADE) ×1 IMPLANT
BLADE SAW SGTL 13.0X1.19X90.0M (BLADE) ×1 IMPLANT
BNDG ELASTIC 6X5.8 VLCR STR LF (GAUZE/BANDAGES/DRESSINGS) ×1 IMPLANT
BOWL SMART MIX CTS (DISPOSABLE) ×1 IMPLANT
CATH COUDE 5CC RIBBED (CATHETERS) IMPLANT
CATH RIBBED COUDE 5CC (CATHETERS) ×1
CEMENT BONE R 1X40 (Cement) ×1 IMPLANT
COMP FEM CMT PS KNEE RM (Joint) ×1 IMPLANT
COMPONENT FEM CMT PS KNEE RM (Joint) IMPLANT
COVER SURGICAL LIGHT HANDLE (MISCELLANEOUS) ×1 IMPLANT
CUFF TOURN SGL QUICK 34 (TOURNIQUET CUFF) ×1
CUFF TRNQT CYL 34X4.125X (TOURNIQUET CUFF) ×1 IMPLANT
DERMABOND ADVANCED .7 DNX12 (GAUZE/BANDAGES/DRESSINGS) ×1 IMPLANT
DRAPE U-SHAPE 47X51 STRL (DRAPES) ×1 IMPLANT
DRESSING AQUACEL AG SP 3.5X10 (GAUZE/BANDAGES/DRESSINGS) ×1 IMPLANT
DRSG AQUACEL AG ADV 3.5X10 (GAUZE/BANDAGES/DRESSINGS) IMPLANT
DRSG AQUACEL AG SP 3.5X10 (GAUZE/BANDAGES/DRESSINGS) ×1
DURAPREP 26ML APPLICATOR (WOUND CARE) ×1 IMPLANT
ELECT REM PT RETURN 15FT ADLT (MISCELLANEOUS) ×1 IMPLANT
GLOVE BIO SURGEON STRL SZ 6 (GLOVE) ×1 IMPLANT
GLOVE BIOGEL PI IND STRL 6.5 (GLOVE) ×1 IMPLANT
GLOVE BIOGEL PI IND STRL 7.5 (GLOVE) ×1 IMPLANT
GLOVE ORTHO TXT STRL SZ7.5 (GLOVE) ×2 IMPLANT
GOWN STRL REUS W/ TWL LRG LVL3 (GOWN DISPOSABLE) ×2 IMPLANT
GOWN STRL REUS W/TWL LRG LVL3 (GOWN DISPOSABLE) ×2
HDLS TROCR DRIL PIN KNEE 75 (PIN) ×1
HOLDER FOLEY CATH W/STRAP (MISCELLANEOUS) IMPLANT
INSERT TIB BEAR CMT PS RM F (Insert) IMPLANT
INSERT TIB BEAR KNEE F 8 RT (Insert) IMPLANT
INSERTER TIP PARTIAL KNEE (MISCELLANEOUS) IMPLANT
KIT TURNOVER KIT A (KITS) IMPLANT
MANIFOLD NEPTUNE II (INSTRUMENTS) ×1 IMPLANT
NDL SAFETY ECLIP 18X1.5 (MISCELLANEOUS) ×1 IMPLANT
PACK TOTAL KNEE CUSTOM (KITS) ×1 IMPLANT
PIN DRILL HDLS TROCAR 75 4PK (PIN) IMPLANT
SCREW HEADED 33MM KNEE (MISCELLANEOUS) IMPLANT
SCREW HEADED 48MM KNEE (MISCELLANEOUS) IMPLANT
SET PAD KNEE POSITIONER (MISCELLANEOUS) ×1 IMPLANT
SPIKE FLUID TRANSFER (MISCELLANEOUS) ×2 IMPLANT
SUT MNCRL AB 4-0 PS2 18 (SUTURE) ×1 IMPLANT
SUT STRATAFIX PDS+ 0 24IN (SUTURE) ×1 IMPLANT
SUT VIC AB 1 CT1 36 (SUTURE) ×1 IMPLANT
SUT VIC AB 2-0 CT1 27 (SUTURE) ×2
SUT VIC AB 2-0 CT1 TAPERPNT 27 (SUTURE) ×2 IMPLANT
SYR 3ML LL SCALE MARK (SYRINGE) ×1 IMPLANT
TRAY FOLEY MTR SLVR 16FR STAT (SET/KITS/TRAYS/PACK) ×1 IMPLANT
WRAP KNEE MAXI GEL POST OP (GAUZE/BANDAGES/DRESSINGS) ×1 IMPLANT

## 2022-01-20 NOTE — Anesthesia Procedure Notes (Signed)
Anesthesia Regional Block: Adductor canal block   Pre-Anesthetic Checklist: , timeout performed,  Correct Patient, Correct Site, Correct Laterality,  Correct Procedure, Correct Position, site marked,  Risks and benefits discussed,  Surgical consent,  Pre-op evaluation,  At surgeon's request and post-op pain management  Laterality: Right  Prep: chloraprep       Needles:  Injection technique: Single-shot  Needle Type: Echogenic Needle     Needle Length: 9cm  Needle Gauge: 21     Additional Needles:   Procedures:,,,, ultrasound used (permanent image in chart),,    Narrative:  Start time: 01/20/2022 6:40 AM End time: 01/20/2022 6:48 AM Injection made incrementally with aspirations every 5 mL.  Performed by: Personally  Anesthesiologist: Santa Lighter, MD  Additional Notes: No pain on injection. No increased resistance to injection. Injection made in 5cc increments.  Good needle visualization.  Patient tolerated procedure well.

## 2022-01-20 NOTE — Evaluation (Signed)
Physical Therapy Evaluation Patient Details Name: Kevin Reyes MRN: [id number] DOB: March 31, 1955 Today's Date: 01/20/2022  History of Present Illness  Patient is 67 y.o. male s/p Rt UKA on 01/20/22 with PMH significant for anxiety, OA,fatty liver, GERD, HLD, MVP.   Clinical Impression  Kevin Reyes is a 67 y.o. male POD 0 s/p Rt UKA. Patient reports independence with mobility at baseline. Patient is now limited by functional impairments (see PT problem list below) and requires min guard/assist for transfers and gait with RW. Patient was able to ambulate 140 feet with RW and min assist. Knee immobilizer required due to poor Rt quad activation to prevent knee buckling in stance phase of gait. Pt educated on donning/doffing of immobilizer to check skin integrity and educated on SLR test to determine readiness to stop use of immobilizer. Pt's spouse arrived for discussion on stair training. Demonstration provided for use of RW and reverse stair negotiation, pt and spouse state fears of falling and that they are not comfortable returning home at this level of mobility. Will plan for stair training tomorrow with hopes that Rt quad activation is improved. Patient will benefit from continued skilled PT interventions to address impairments and progress towards PLOF. Acute PT will follow to progress mobility and stair training in preparation for safe discharge home.        Recommendations for follow up therapy are one component of a multi-disciplinary discharge planning process, led by the attending physician.  Recommendations may be updated based on patient status, additional functional criteria and insurance authorization.  Follow Up Recommendations Follow physician's recommendations for discharge plan and follow up therapies      Assistance Recommended at Discharge Frequent or constant Supervision/Assistance  Patient can return home with the following  A little help with walking and/or transfers;A  little help with bathing/dressing/bathroom;Assistance with cooking/housework;Direct supervision/assist for medications management;Help with stairs or ramp for entrance;Assist for transportation    Equipment Recommendations Rolling walker (2 wheels)  Recommendations for Other Services       Functional Status Assessment Patient has had a recent decline in their functional status and demonstrates the ability to make significant improvements in function in a reasonable and predictable amount of time.     Precautions / Restrictions Precautions Precautions: Fall Restrictions Weight Bearing Restrictions: No      Mobility  Bed Mobility Overal bed mobility: Needs Assistance Bed Mobility: Supine to Sit     Supine to sit: Min guard, HOB elevated     General bed mobility comments: pt taking extra time, able to pivot and scoot to EOB    Transfers Overall transfer level: Needs assistance Equipment used: Rolling walker (2 wheels) Transfers: Sit to/from Stand Sit to Stand: Min guard, From elevated surface           General transfer comment: Cues for hand placement with RW. pt performed Lt single limb s<>s due to Rt LE weakness. immobilizer in place.    Ambulation/Gait Ambulation/Gait assistance: Min assist, Min guard Gait Distance (Feet): 140 Feet Assistive device: Rolling walker (2 wheels) Gait Pattern/deviations: Decreased stride length, Step-to pattern, Decreased weight shift to right, Knees buckling Gait velocity: decr     General Gait Details: cues for step to pattern and position to RW to facilitate improved use of UE's to prevent Rt knee buckling. Immobilizer in place for safety.  Stairs Stairs:  (demonstrated/described but pt did not complete)          Wheelchair Mobility    Modified Rankin (Stroke  Patients Only)       Balance Overall balance assessment: Needs assistance Sitting-balance support: Feet supported Sitting balance-Leahy Scale: Good      Standing balance support: Reliant on assistive device for balance, Bilateral upper extremity supported, During functional activity Standing balance-Leahy Scale: Poor                               Pertinent Vitals/Pain Pain Assessment Pain Assessment: No/denies pain    Home Living Family/patient expects to be discharged to:: Private residence Living Arrangements: Spouse/significant other Available Help at Discharge: Family Type of Home: House Home Access: Stairs to enter Entrance Stairs-Rails: None Entrance Stairs-Number of Steps: 2-3 Alternate Level Stairs-Number of Steps: 14 Home Layout: Two level;Able to live on main level with bedroom/bathroom;Bed/bath upstairs;Full bath on main level Home Equipment: None (RW given in PACU)      Prior Function Prior Level of Function : Independent/Modified Independent             Mobility Comments: was going to the gym~3x/week       Hand Dominance   Dominant Hand: Right    Extremity/Trunk Assessment   Upper Extremity Assessment Upper Extremity Assessment: Overall WFL for tasks assessed    Lower Extremity Assessment Lower Extremity Assessment: RLE deficits/detail RLE Deficits / Details: pt with no activation of Rt quad and unable to maintain extension with SLR RLE Sensation: WNL (no pain at knee) RLE Coordination: decreased gross motor    Cervical / Trunk Assessment Cervical / Trunk Assessment: Normal  Communication   Communication: No difficulties  Cognition Arousal/Alertness: Awake/alert Behavior During Therapy: WFL for tasks assessed/performed Overall Cognitive Status: Within Functional Limits for tasks assessed                                          General Comments      Exercises     Assessment/Plan    PT Assessment Patient needs continued PT services  PT Problem List Decreased strength;Decreased range of motion;Decreased activity tolerance;Decreased balance;Decreased  mobility;Decreased coordination;Decreased knowledge of use of DME;Decreased safety awareness;Decreased knowledge of precautions       PT Treatment Interventions DME instruction;Gait training;Stair training;Functional mobility training;Therapeutic activities;Therapeutic exercise;Balance training;Patient/family education    PT Goals (Current goals can be found in the Care Plan section)  Acute Rehab PT Goals Patient Stated Goal: be able to get back to gym PT Goal Formulation: With patient Time For Goal Achievement: 02/03/22 Potential to Achieve Goals: Good    Frequency 7X/week     Co-evaluation               AM-PAC PT "6 Clicks" Mobility  Outcome Measure Help needed turning from your back to your side while in a flat bed without using bedrails?: A Little Help needed moving from lying on your back to sitting on the side of a flat bed without using bedrails?: A Little Help needed moving to and from a bed to a chair (including a wheelchair)?: A Little Help needed standing up from a chair using your arms (e.g., wheelchair or bedside chair)?: A Little Help needed to walk in hospital room?: A Little Help needed climbing 3-5 steps with a railing? : Total 6 Click Score: 16    End of Session Equipment Utilized During Treatment: Gait belt Activity Tolerance: Patient tolerated treatment well Patient left: in  chair;with call bell/phone within reach;with family/visitor present Nurse Communication: Mobility status PT Visit Diagnosis: Unsteadiness on feet (R26.81);Muscle weakness (generalized) (M62.81);Difficulty in walking, not elsewhere classified (R26.2)    Time: 1587-2761 PT Time Calculation (min) (ACUTE ONLY): 45 min   Charges:   PT Evaluation $PT Eval Low Complexity: 1 Low PT Treatments $Gait Training: 8-22 mins $Self Care/Home Management: 8-22        Verner Mould, DPT Acute Rehabilitation Services Office [phone]  01/20/22 1:57 PM

## 2022-01-20 NOTE — Brief Op Note (Signed)
01/20/2022  8:42 AM  PATIENT:  Kevin Reyes  67 y.o. male  PRE-OPERATIVE DIAGNOSIS:  Right knee osteoarthritis medially  POST-OPERATIVE DIAGNOSIS:  Right knee osteoarthritis medially  PROCEDURE:  Procedure(s): UNICOMPARTMENTAL KNEE MEDIALLY (Right) Zimmer Person, size 4 femur, size F tibia and size 8 insert  SURGEON:  Surgeon(s) and Role:    Paralee Cancel, MD - Primary  PHYSICIAN ASSISTANT: Costella Hatcher, PA-C  ANESTHESIA:   regional and spinal  EBL:  50 mL   BLOOD ADMINISTERED:none  DRAINS: none   LOCAL MEDICATIONS USED:  MARCAINE     SPECIMEN:  No Specimen  DISPOSITION OF SPECIMEN:  N/A  COUNTS:  YES  TOURNIQUET:   Total Tourniquet Time Documented: Thigh (Right) - 26 minutes Total: Thigh (Right) - 26 minutes   DICTATION: .Other Dictation: Dictation Number [id number]  PLAN OF CARE: Discharge to home after PACU  PATIENT DISPOSITION:  PACU - hemodynamically stable.   Delay start of Pharmacological VTE agent (>24hrs) due to surgical blood loss or risk of bleeding: no

## 2022-01-20 NOTE — Op Note (Signed)
NAME: Kevin Reyes, Kevin Reyes MEDICAL RECORD NO: [id number] ACCOUNT NO: [id number] DATE OF BIRTH: 17-Jun-1955 FACILITY: [facility] LOCATION: [facility] PHYSICIAN: Pietro Cassis. Alvan Dame, MD  Operative Report   DATE OF PROCEDURE: 01/20/2022  PREOPERATIVE DIAGNOSIS:  Right knee medial compartment osteoarthritis.  POSTOPERATIVE DIAGNOSIS:  Right knee medial compartment osteoarthritis.  PROCEDURES PERFORMED:  Right knee medial compartment partial knee arthroplasty utilizing Zimmer Persona fixed bearing knee system with a size 4 right femur, right F tibial baseplate and a size 8 insert to match the F tibial tray.  SURGEON:  Pietro Cassis. Alvan Dame, MD  ASSISTANT:  Costella Hatcher, PA-C.  Note, Ms. Lu Duffel was present for the entirety of the case from preoperative positioning, perioperative management of the operative extremity, general facilitation of the case and primary wound closure.  ANESTHESIA:  Regional plus spinal.  BLOOD LOSS:  Less than 200 mL.  DRAINS:  None.  COMPLICATIONS:  None.  TOURNIQUET:  Up for 26 minutes at 225 mmHg.  INDICATIONS FOR THE PROCEDURE:  The patient is a very pleasant 67 year old male with history of right knee arthroscopy.  He had progressive pain in the medial side of the knee.  He failed conservative treatment including medications and injections.   Radiographs and MRI had indicated significant progression of osteoarthritis medially with complete loss of joint space in the anterior medial wear pattern on his lateral radiograph.  Based on his isolated pain medially and the radiographic findings, we  discussed proceeding with partial versus total knee replacement.  Risks of infection, DVT, component failure, need for future surgeries were discussed and reviewed.  The potential for progression of arthritis in the patellofemoral and lateral  compartments were reviewed.  Consent was obtained for benefit of pain relief.  DESCRIPTION OF PROCEDURE:  The patient was brought to the operative  theater.  Once adequate anesthesia, preoperative antibiotics, Ancef administered as well as tranexamic acid and Decadron, he was positioned supine with a right thigh tourniquet placed.   The right lower extremity was prepped and draped in sterile fashion.  A timeout was performed identifying the patient, planned procedure, and extremity.  The leg was exsanguinated and tourniquet elevated to 225 mmHg.  A paramidline incision was used from  the proximal pole of patella to the tibial tubercle.  Soft tissue exposure was obtained.  I then made a median arthrotomy.  He was noted to have a clear patellofemoral compartment as well as advanced degenerative changes medially.  We proceed with  partial knee replacement.  Following initial exposure, attention was first directed to the proximal tibial cut.  Using extramedullary guide, I resected 4 mm off the proximal tibia using the stylus.  Following this resection, we placed the 9 mm distal  femoral cutting block on the tibial surface with the knee in extension.  This was pinned into place and the distal femoral cut made.  The cut was finished with the knee in flexion to try to prevent complications posteriorly.  Following this, I sized the  femur to be a size 4.  The size 4 jig was pinned into position.  The drill holes and saw cuts were made.  I then sized the tibia to be a size F.  It was pinned into place and drilled.  We did a trial reduction with the 4 femur, the F tibial tray in  place.  I selected the 8 mm insert that had the appropriate tension on the medial collateral ligaments in both extension and flexion.  Given these findings, the trial components  were removed.  We drilled sclerotic bone to allow for cement  interdigitation.  I injected the posterior aspect of the knee with 0.25% Marcaine with epinephrine and Toradol and saline.  This was done following aspiration to make certain that there was no complications.  The final components were opened and cement   was mixed.  We irrigated the knee with normal saline solution.  The final components were then cemented into place and brought to extension about 30 degrees of extension with the size 2 mm amber stick in place to allow for further compression.  Once the  cement fully cured and we removed remaining cement, the final 8 mm insert to match the F tibial baseplate was opened and snapped into place.  The tourniquet was let down after 26 minutes without significant hemostasis required.  We irrigated the knee  again.  The extensor mechanism was then reapproximated using #1 Vicryl and #1 Stratafix suture.  The remainder of wound was closed in layers with 2-0 Vicryl and a running Monocryl stitch.  The knee was clean, dry and dressed sterilely using surgical glue  and Aquacel dressing.  The patient was then brought to the recovery room in stable condition, tolerated the procedure well.  Findings reviewed with his wife.  Plan is for discharge home today after recovery room.   MUK D: 01/20/2022 8:49:32 am T: 01/20/2022 9:10:00 am  JOB: [id number]/ [id number]

## 2022-01-20 NOTE — Anesthesia Postprocedure Evaluation (Signed)
Anesthesia Post Note  Patient: Kevin Reyes  Procedure(s) Performed: UNICOMPARTMENTAL KNEE MEDIALLY (Right: Knee)     Patient location during evaluation: PACU Anesthesia Type: Spinal Level of consciousness: awake, awake and alert and oriented Pain management: pain level controlled Vital Signs Assessment: post-procedure vital signs reviewed and stable Respiratory status: spontaneous breathing, nonlabored ventilation and respiratory function stable Cardiovascular status: blood pressure returned to baseline and stable Postop Assessment: no headache, no backache, spinal receding and no apparent nausea or vomiting Anesthetic complications: no   No notable events documented.  Last Vitals:  Vitals:   01/20/22 1100 01/20/22 1134  BP: (!) 128/90 124/72  Pulse: (!) 45 (!) 47  Resp: 16 20  Temp:  (!) 36.1 C  SpO2: 99% 98%    Last Pain:  Vitals:   01/20/22 1000  TempSrc:   PainSc: [place]

## 2022-01-20 NOTE — Interval H&P Note (Signed)
History and Physical Interval Note:  01/20/2022 6:53 AM  Kevin Reyes  has presented today for surgery, with the diagnosis of Right knee osteoarthritis medially.  The various methods of treatment have been discussed with the patient and family. After consideration of risks, benefits and other options for treatment, the patient has consented to  Procedure(s): UNICOMPARTMENTAL KNEE MEDIALLY (Right) as a surgical intervention.  The patient's history has been reviewed, patient examined, no change in status, stable for surgery.  I have reviewed the patient's chart and labs.  Questions were answered to the patient's satisfaction.     Mauri Pole

## 2022-01-20 NOTE — Anesthesia Procedure Notes (Addendum)
Procedure Name: MAC Date/Time: 01/20/2022 7:22 AM  Performed by: West Pugh, CRNAPre-anesthesia Checklist: Patient identified, Emergency Drugs available, Suction available, Patient being monitored and Timeout performed Patient Re-evaluated:Patient Re-evaluated prior to induction Oxygen Delivery Method: Simple face mask Preoxygenation: Pre-oxygenation with 100% oxygen Placement Confirmation: positive ETCO2 Dental Injury: Teeth and Oropharynx as per pre-operative assessment

## 2022-01-20 NOTE — Progress Notes (Signed)
Orthopedic Tech Progress Note Patient Details:  [name] 13-Oct-1955 [id number]  Patient ID: Kevin Reyes, male   DOB: Jun 13, 1955, 67 y.o.   MRN: [id number]  [name] 01/20/2022, 1:07 PM Knee immobilizer deliver to pacu for use with PT

## 2022-01-20 NOTE — Anesthesia Procedure Notes (Signed)
Spinal  Patient location during procedure: OR Start time: 01/20/2022 7:25 AM End time: 01/20/2022 7:28 AM Reason for block: surgical anesthesia Staffing Performed: resident/CRNA  Anesthesiologist: Santa Lighter, MD Resident/CRNA: West Pugh, CRNA Performed by: West Pugh, CRNA Authorized by: Santa Lighter, MD   Preanesthetic Checklist Completed: patient identified, IV checked, site marked, risks and benefits discussed, surgical consent, monitors and equipment checked, pre-op evaluation and timeout performed Spinal Block Patient position: sitting Prep: DuraPrep and site prepped and draped Patient monitoring: heart rate, cardiac monitor, continuous pulse ox and blood pressure Approach: midline Location: L3-4 Injection technique: single-shot Needle Needle type: Pencan  Needle gauge: 24 G Needle length: 10 cm Assessment Sensory level: T4 Events: CSF return Additional Notes IV functioning, monitors applied to pt. Expiration date of kit checked and confirmed to be in date. Patient denies use of anticoagulants. Platelet count 245,000. Sterile prep and drape, hand hygiene and sterile gloved used. Pt was positioned and spine was prepped in sterile fashion. Skin was anesthetized with lidocaine. Free flow of clear CSF obtained prior to injecting local anesthetic into CSF. Spinal needle aspirated freely following injection. Needle was carefully withdrawn, and pt tolerated procedure well. Loss of motor and sensory on exam post injection. Dr Gifford Shave present for procedure.

## 2022-01-20 NOTE — Transfer of Care (Signed)
Immediate Anesthesia Transfer of Care Note  Patient: Kevin Reyes  Procedure(s) Performed: UNICOMPARTMENTAL KNEE MEDIALLY (Right: Knee)  Patient Location: PACU  Anesthesia Type:Spinal and MAC combined with regional for post-op pain  Level of Consciousness: awake, alert , and patient cooperative  Airway & Oxygen Therapy: Patient Spontanous Breathing and Patient connected to face mask oxygen  Post-op Assessment: Report given to RN and Post -op Vital signs reviewed and stable  Post vital signs: Reviewed and stable  Last Vitals:  Vitals Value Taken Time  BP 106/47 01/20/22 0903  Temp 36.4 C 01/20/22 0903  Pulse 50 01/20/22 0906  Resp 12 01/20/22 0906  SpO2 97 % 01/20/22 0906  Vitals shown include unvalidated device data.  Last Pain:  Vitals:   01/20/22 0903  TempSrc:   PainSc: 0-No pain         Complications: No notable events documented.

## 2022-01-20 NOTE — Discharge Instructions (Signed)

## 2022-01-21 ENCOUNTER — Encounter (HOSPITAL_COMMUNITY): Payer: Self-pay | Admitting: Orthopedic Surgery

## 2022-01-21 DIAGNOSIS — M1711 Unilateral primary osteoarthritis, right knee: Secondary | ICD-10-CM | POA: Diagnosis not present

## 2022-01-21 DIAGNOSIS — Z85828 Personal history of other malignant neoplasm of skin: Secondary | ICD-10-CM | POA: Diagnosis not present

## 2022-01-21 DIAGNOSIS — Z79899 Other long term (current) drug therapy: Secondary | ICD-10-CM | POA: Diagnosis not present

## 2022-01-21 LAB — CBC
HCT: 40.7 % (ref 39.0–52.0)
Hemoglobin: 13.8 g/dL (ref 13.0–17.0)
MCH: 30.3 pg (ref 26.0–34.0)
MCHC: 33.9 g/dL (ref 30.0–36.0)
MCV: 89.3 fL (ref 80.0–100.0)
Platelets: 249 10*3/uL (ref 150–400)
RBC: 4.56 MIL/uL (ref 4.22–5.81)
RDW: 13 % (ref 11.5–15.5)
WBC: 23.4 10*3/uL — ABNORMAL HIGH (ref 4.0–10.5)
nRBC: 0 % (ref 0.0–0.2)

## 2022-01-21 LAB — BASIC METABOLIC PANEL
Anion gap: 7 (ref 5–15)
BUN: 26 mg/dL — ABNORMAL HIGH (ref 8–23)
CO2: 23 mmol/L (ref 22–32)
Calcium: 8.8 mg/dL — ABNORMAL LOW (ref 8.9–10.3)
Chloride: 106 mmol/L (ref 98–111)
Creatinine, Ser: 1.17 mg/dL (ref 0.61–1.24)
GFR, Estimated: >60 mL/min (ref >60)
Glucose, Bld: 128 mg/dL — ABNORMAL HIGH (ref 70–99)
Potassium: 4.4 mmol/L (ref 3.5–5.1)
Sodium: 136 mmol/L (ref 135–145)

## 2022-01-21 NOTE — Progress Notes (Signed)
   Subjective: 1 Day Post-Op Procedure(s) (LRB): UNICOMPARTMENTAL KNEE MEDIALLY (Right) Patient reports pain as mild.   Patient seen in rounds by Dr. Alvan Dame. Patient is well, and has had no acute complaints or problems. No acute events overnight. Foley catheter removed. Patient ambulated 140 feet with PT. Plan to practice stairs today. We will continue therapy today.   Objective: Vital signs in last 24 hours: Temp:  [97 F (36.1 C)-98.4 F (36.9 C)] 97.7 F (36.5 C) (01/10 0612) Pulse Rate:  [45-57] 54 (01/10 0612) Resp:  [12-20] 16 (01/10 0612) BP: (104-136)/(41-90) 116/60 (01/10 0612) SpO2:  [94 %-100 %] 98 % (01/10 0612) Weight:  [87.1 kg] 87.1 kg (01/09 1707)  Intake/Output from previous day:  Intake/Output Summary (Last 24 hours) at 01/21/2022 0742 Last data filed at 01/21/2022 0200 Gross per 24 hour  Intake 2537.81 ml  Output 600 ml  Net 1937.81 ml     Intake/Output this shift: No intake/output data recorded.  Labs: Recent Labs    01/21/22 0726  HGB 13.8   Recent Labs    01/21/22 0726  WBC 23.4*  RBC 4.56  HCT 40.7  PLT 249   No results for input(s): "NA", "K", "CL", "CO2", "BUN", "CREATININE", "GLUCOSE", "CALCIUM" in the last 72 hours. No results for input(s): "LABPT", "INR" in the last 72 hours.  Exam: General - Patient is Alert and Oriented Extremity - Neurologically intact Sensation intact distally Intact pulses distally Dorsiflexion/Plantar flexion intact Dressing - dressing C/D/I Motor Function - intact, moving foot and toes well on exam.   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      Assessment/Plan: 1 Day Post-Op Procedure(s) (LRB): UNICOMPARTMENTAL KNEE MEDIALLY (Right) Principal Problem:   S/P right unicompartmental knee replacement  Estimated body mass index is 27.55 kg/m as calculated from the following:   Height as of this encounter: '5\' 10"'$  (1.778 m).   Weight as of this encounter: 87.1 kg. Advance diet Up with therapy D/C IV fluids   Patient's anticipated LOS is less than 2 midnights, meeting these requirements: - Younger than 75 - Lives within 1 hour of care - Has a competent adult at home to recover with post-op recover - NO history of  - Chronic pain requiring opiods  - Diabetes  - Coronary Artery Disease  - Heart failure  - Heart attack  - Stroke  - DVT/VTE  - Cardiac arrhythmia  - Respiratory Failure/COPD  - Renal failure  - Anemia  - Advanced Liver disease     DVT Prophylaxis - Aspirin Weight bearing as tolerated.  Hgb stable at 13.8 this AM.  Plan is to go Home after hospital stay. Plan for discharge today following 1-2 sessions of PT as long as they are meeting their goals. Patient is scheduled for OPPT. Follow up in the office in 2 weeks.   Griffith Citron, PA-C Orthopedic Surgery [phone] 01/21/2022, 7:42 AM

## 2022-01-21 NOTE — Progress Notes (Signed)
Physical Therapy Treatment Patient Details Name: Kevin Reyes MRN: [id number] DOB: July 09, 1955 Today's Date: 01/21/2022   History of Present Illness Patient is 67 y.o. male s/p Rt UKA on 01/20/22 with PMH significant for anxiety, OA,fatty liver, GERD, HLD, MVP.    PT Comments    Pt is progressing well this session, much improved quad control now that anesthesia resolved.  Amb and reviewed HEP, will see again to review stairs this pm, pt is motivated to go home today   Recommendations for follow up therapy are one component of a multi-disciplinary discharge planning process, led by the attending physician.  Recommendations may be updated based on patient status, additional functional criteria and insurance authorization.  Follow Up Recommendations  Follow physician's recommendations for discharge plan and follow up therapies     Assistance Recommended at Discharge Frequent or constant Supervision/Assistance  Patient can return home with the following A little help with walking and/or transfers;A little help with bathing/dressing/bathroom;Assistance with cooking/housework;Direct supervision/assist for medications management;Help with stairs or ramp for entrance;Assist for transportation   Equipment Recommendations  Rolling walker (2 wheels)    Recommendations for Other Services       Precautions / Restrictions Precautions Precautions: Fall Restrictions RLE Weight Bearing: Weight bearing as tolerated     Mobility  Bed Mobility Overal bed mobility: Needs Assistance Bed Mobility: Supine to Sit     Supine to sit: Supervision     General bed mobility comments: for safety    Transfers Overall transfer level: Needs assistance Equipment used: Rolling walker (2 wheels) Transfers: Sit to/from Stand Sit to Stand: Supervision           General transfer comment: cues for proper hand placement    Ambulation/Gait Ambulation/Gait assistance: Supervision, Min guard Gait  Distance (Feet): 150 Feet Assistive device: Rolling walker (2 wheels) Gait Pattern/deviations: Step-to pattern, Decreased weight shift to left       General Gait Details: cues for sequence and RW position from self   Stairs             Wheelchair Mobility    Modified Rankin (Stroke Patients Only)       Balance     Sitting balance-Leahy Scale: Good     Standing balance support: Reliant on assistive device for balance, Bilateral upper extremity supported, During functional activity Standing balance-Leahy Scale: Poor                              Cognition Arousal/Alertness: Awake/alert Behavior During Therapy: WFL for tasks assessed/performed Overall Cognitive Status: Within Functional Limits for tasks assessed                                          Exercises Total Joint Exercises Ankle Circles/Pumps: AROM, Both, 10 reps Quad Sets: AROM, 10 reps, Right Heel Slides: AROM, Right, 10 reps Straight Leg Raises: AROM, AAROM, Right, 10 reps Long Arc Quad: AROM, Right, 10 reps Goniometric ROM: grossly 6 to 95 degrees right knee flexion    General Comments        Pertinent Vitals/Pain Pain Assessment Pain Assessment: No/denies pain    Home Living                          Prior Function  PT Goals (current goals can now be found in the care plan section) Acute Rehab PT Goals PT Goal Formulation: With patient Time For Goal Achievement: 02/03/22 Potential to Achieve Goals: Good Progress towards PT goals: Progressing toward goals    Frequency    7X/week      PT Plan Current plan remains appropriate    Co-evaluation              AM-PAC PT "6 Clicks" Mobility   Outcome Measure  Help needed turning from your back to your side while in a flat bed without using bedrails?: A Little Help needed moving from lying on your back to sitting on the side of a flat bed without using bedrails?: A  Little Help needed moving to and from a bed to a chair (including a wheelchair)?: A Little Help needed standing up from a chair using your arms (e.g., wheelchair or bedside chair)?: A Little Help needed to walk in hospital room?: A Little Help needed climbing 3-5 steps with a railing? : A Little 6 Click Score: 18    End of Session Equipment Utilized During Treatment: Gait belt Activity Tolerance: Patient tolerated treatment well Patient left: in chair;with call bell/phone within reach Nurse Communication: Mobility status PT Visit Diagnosis: Unsteadiness on feet (R26.81);Muscle weakness (generalized) (M62.81);Difficulty in walking, not elsewhere classified (R26.2)     Time: 3568-6168 PT Time Calculation (min) (ACUTE ONLY): 20 min  Charges:  $Gait Training: 8-22 mins                     Baxter Flattery, PT  Acute Rehab Dept [facility]) [phone]  [facility] Weekend Pager [facility] only)  [phone]  01/21/2022    [facility] 01/21/2022, 10:12 AM

## 2022-01-21 NOTE — Plan of Care (Signed)
Pt ready to DC home with wife.

## 2022-01-21 NOTE — Plan of Care (Signed)
Plan of care reviewed and discussed with the patient. 

## 2022-01-21 NOTE — Progress Notes (Signed)
Physical Therapy Treatment Patient Details Name: Kevin Reyes MRN: [id number] DOB: [birth year] Today's Date: 01/21/2022   History of Present Illness Patient is 67 y.o. male s/p Rt UKA on 01/20/22 with PMH significant for anxiety, OA,fatty liver, GERD, HLD, MVP.    PT Comments    Pt ready to d/c with family assist as needed. See below for stair training, pt will have his son assisting and wife also present for entry into home and up the flight of stairs. Pt plans to stay upstairs until OPPT appt.  Advised pt to utilize [facility] for stairs.   Recommendations for follow up therapy are one component of a multi-disciplinary discharge planning process, led by the attending physician.  Recommendations may be updated based on patient status, additional functional criteria and insurance authorization.  Follow Up Recommendations  Follow physician's recommendations for discharge plan and follow up therapies     Assistance Recommended at Discharge Frequent or constant Supervision/Assistance  Patient can return home with the following A little help with walking and/or transfers;A little help with bathing/dressing/bathroom;Assistance with cooking/housework;Direct supervision/assist for medications management;Help with stairs or ramp for entrance;Assist for transportation   Equipment Recommendations  Rolling walker (2 wheels)    Recommendations for Other Services       Precautions / Restrictions Precautions Precautions: Fall Restrictions RLE Weight Bearing: Weight bearing as tolerated     Mobility  Bed Mobility Overal bed mobility: Needs Assistance Bed Mobility: Supine to Sit     Supine to sit: Supervision     General bed mobility comments: for safety    Transfers Overall transfer level: Needs assistance Equipment used: Rolling walker (2 wheels) Transfers: Sit to/from Stand Sit to Stand: Supervision           General transfer comment: cues for proper hand placement;     Ambulation/Gait Ambulation/Gait assistance: Supervision, Min guard Gait Distance (Feet): 70 Feet Assistive device: Rolling walker (2 wheels) Gait Pattern/deviations: Step-to pattern, Decreased weight shift to left       General Gait Details: cues for sequence and RW position from self, remainign with step to  gait for safety for now. good carryover during session   Stairs Stairs: Yes Stairs assistance: Min assist Stair Management: One rail Left, Step to pattern, Forwards, Backwards, With cane Number of Stairs: 5 (3) General stair comments: cues for sequence and use of rail and cane.  initial buckling R knee with ascending stairs, placed KI on and cued for quad activation/knee extension. repeated up/down stairs with cane and rail with good stability, no knee buckling. advised to wear KI for stairs at home. also reviewed posterior technique with RW should pt choose secondary entry at home.  handouts given and reviewed.   Wheelchair Mobility    Modified Rankin (Stroke Patients Only)       Balance     Sitting balance-Leahy Scale: Good     Standing balance support: Reliant on assistive device for balance, Bilateral upper extremity supported, During functional activity Standing balance-Leahy Scale: Poor                              Cognition Arousal/Alertness: Awake/alert Behavior During Therapy: WFL for tasks assessed/performed Overall Cognitive Status: Within Functional Limits for tasks assessed  Exercises Total Joint Exercises Ankle Circles/Pumps: AROM, Both, 10 reps Quad Sets: AROM, 10 reps, Right Heel Slides: AROM, Right, 10 reps Straight Leg Raises: AROM, AAROM, Right, 10 reps Long Arc Quad: AROM, Right, 10 reps Goniometric ROM: grossly 6 to 95 degrees right knee flexion    General Comments        Pertinent Vitals/Pain Pain Assessment Pain Assessment: 0-10 Pain Score: 5  Pain Location:  R knee Pain Descriptors / Indicators: Aching, Grimacing, Discomfort Pain Intervention(s): Limited activity within patient's tolerance, Monitored during session, Premedicated before session    Home Living                          Prior Function            PT Goals (current goals can now be found in the care plan section) Acute Rehab PT Goals PT Goal Formulation: With patient Time For Goal Achievement: 02/03/22 Potential to Achieve Goals: Good Progress towards PT goals: Progressing toward goals    Frequency    7X/week      PT Plan Current plan remains appropriate    Co-evaluation              AM-PAC PT "6 Clicks" Mobility   Outcome Measure  Help needed turning from your back to your side while in a flat bed without using bedrails?: A Little Help needed moving from lying on your back to sitting on the side of a flat bed without using bedrails?: A Little Help needed moving to and from a bed to a chair (including a wheelchair)?: A Little Help needed standing up from a chair using your arms (e.g., wheelchair or bedside chair)?: A Little Help needed to walk in hospital room?: A Little Help needed climbing 3-5 steps with a railing? : A Little 6 Click Score: 18    End of Session Equipment Utilized During Treatment: Gait belt Activity Tolerance: Patient tolerated treatment well Patient left: in chair;with call bell/phone within reach Nurse Communication: Mobility status PT Visit Diagnosis: Unsteadiness on feet (R26.81);Muscle weakness (generalized) (M62.81);Difficulty in walking, not elsewhere classified (R26.2)     Time: 2263-3354 PT Time Calculation (min) (ACUTE ONLY): 23 min  Charges:  $Gait Training: 23-37 mins                     Baxter Flattery, PT  Acute Rehab Dept [facility]) [phone]  [facility] Weekend Pager [facility] only)  [phone]  01/21/2022    [facility] 01/21/2022, 2:15 PM

## 2022-01-21 NOTE — TOC Transition Note (Signed)
Transition of Care [facility]) - CM/SW Discharge Note  Patient Details  Name: Kevin Reyes MRN: [id number] Date of Birth: [birth year]  Transition of Care [facility]) CM/SW Contact:  Sherie Don, LCSW Phone Number: 01/21/2022, 10:46 AM  Clinical Narrative: Patient is expected to discharge home after working with PT. CSW met with patient to review discharge plan and needs. Patient will go home with OPPT at [facility] PT. Patient will need a rolling walker, which [employer] provided. TOC signing off.    Final next level of care: OP Rehab Barriers to Discharge: No Barriers Identified  Patient Goals and CMS Choice CMS Medicare.gov Compare Post Acute Care list provided to:: Patient Choice offered to / list presented to : Patient  Discharge Plan and Services Additional resources added to the After Visit Summary for          DME Arranged: Walker rolling DME Agency: [employer] Date DME Agency Contacted: 01/21/22 Representative spoke with at DME Agency: [place] Determinants of Health ([place]) Interventions SDOH Screenings   Food Insecurity: No Food Insecurity (01/20/2022)  Housing: Low Risk  (01/20/2022)  Transportation Needs: No Transportation Needs (01/20/2022)  Utilities: Not At Risk (01/20/2022)  Tobacco Use: Low Risk  (01/20/2022)   Readmission Risk Interventions     No data to display

## 2022-01-23 DIAGNOSIS — Z471 Aftercare following joint replacement surgery: Secondary | ICD-10-CM | POA: Diagnosis not present

## 2022-01-23 DIAGNOSIS — Z96651 Presence of right artificial knee joint: Secondary | ICD-10-CM | POA: Diagnosis not present

## 2022-01-27 DIAGNOSIS — Z96651 Presence of right artificial knee joint: Secondary | ICD-10-CM | POA: Diagnosis not present

## 2022-01-27 DIAGNOSIS — Z471 Aftercare following joint replacement surgery: Secondary | ICD-10-CM | POA: Diagnosis not present

## 2022-01-29 NOTE — Discharge Summary (Signed)
Patient ID: Kevin Reyes MRN: [id number] DOB/AGE: [birth year] 67 y.o.  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     Surgeries: Procedure(s): UNICOMPARTMENTAL KNEE MEDIALLY on 01/20/2022   Consultants:   Discharged Condition: Improved  Hospital Course: Kevin Reyes is an 67 y.o. male who was admitted 01/20/2022 for operative treatment ofS/P right unicompartmental knee replacement. Patient has severe unremitting pain that affects sleep, daily activities, and work/hobbies. After pre-op clearance the patient was taken to the operating room on 01/20/2022 and underwent  Procedure(s): UNICOMPARTMENTAL KNEE MEDIALLY.    Patient was given perioperative antibiotics:  Anti-infectives (From admission, onward)    Start     Dose/Rate Route Frequency Ordered Stop   01/20/22 1345  ceFAZolin (ANCEF) 2-4 GM/100ML-% IVPB       Note to Pharmacy: Valinda Party C: cabinet override      01/20/22 1345 01/21/22 0159   01/20/22 1330  ceFAZolin (ANCEF) IVPB 2g/100 mL premix        2 g 200 mL/hr over 30 Minutes Intravenous Every 6 hours 01/20/22 0938 01/20/22 1907   01/20/22 0600  ceFAZolin (ANCEF) IVPB 2g/100 mL premix        2 g 200 mL/hr over 30 Minutes Intravenous On call to O.R. 01/20/22 0532 01/20/22 0759        Patient was given sequential compression devices, early ambulation, and chemoprophylaxis to prevent DVT. Patient worked with PT  and was meeting their goals regarding safe ambulation and transfers.  Patient benefited maximally from hospital stay and there were no complications.    Recent vital signs: No data found.   Recent laboratory studies: No results for input(s): "WBC", "HGB", "HCT", "PLT", "NA", "K", "CL", "CO2", "BUN", "CREATININE", "GLUCOSE", "INR", "CALCIUM" in the last 72 hours.  Invalid input(s): "PT", "2"   Discharge Medications:   Allergies as of 01/21/2022   No Known Allergies      Medication List     TAKE these medications    aspirin 81 MG chewable tablet Commonly known as: Aspirin Childrens Chew 1 tablet (81 mg total) by mouth in the morning and at bedtime for 28 days.   atorvastatin 20 MG tablet Commonly known as: LIPITOR TAKE 1 TABLET BY MOUTH DAILY AT 6 PM. PLEASE MAKE OVERDUE APPT BEFORE ANYMORE REFILLS   busPIRone 30 MG tablet Commonly known as: BUSPAR Take 10 mg by mouth in the morning and at bedtime.   dicyclomine 10 MG capsule Commonly known as: BENTYL Take 1 capsule (10 mg total) by mouth 4 (four) times daily -  before meals and at bedtime.   finasteride 5 MG tablet Commonly known as: PROSCAR Take 5 mg by mouth daily.   gabapentin 300 MG capsule Commonly known as: NEURONTIN Take 600 mg by mouth at bedtime.   methocarbamol 500 MG tablet Commonly known as: ROBAXIN Take 1 tablet (  500 mg total) by mouth every 6 (six) hours as needed for muscle spasms.   oxyCODONE 5 MG immediate release tablet Commonly known as: Roxicodone Take 1 tablet (5 mg total) by mouth every 4 (four) hours as needed for severe pain.   pantoprazole 40 MG tablet Commonly known as: PROTONIX Take 1 tablet (40 mg total) by mouth 2 (two) times daily.   polyethylene glycol 17 g packet Commonly known as: MIRALAX / GLYCOLAX Take 17 g by mouth 2 (two) times daily.   propranolol 20 MG tablet Commonly known as: INDERAL Take 20 mg by mouth 3 (three) times daily.   senna 8.6 MG Tabs tablet Commonly  known as: SENOKOT Take 1 tablet (8.6 mg total) by mouth at bedtime for 14 days.   sildenafil 100 MG tablet Commonly known as: VIAGRA 1 tablet as needed   TYRVAYA NA Place 1 drop into both eyes in the morning and at bedtime.       ASK your doctor about these medications    tranexamic acid 650 MG Tabs tablet Commonly known as: LYSTEDA Take 3 tablets (1,950 mg total) by mouth daily for 3 days. Start on post op day 1 Ask about: Should I take this medication?               Discharge Care Instructions  (From admission, onward)           Start     Ordered   01/21/22 0000  Change dressing       Comments: Maintain surgical dressing until follow up in the clinic. If the edges start to pull up, may reinforce with tape. If the dressing is no longer working, may remove and cover with gauze and tape, but must keep the area dry and clean.  Call with any questions or concerns.   01/21/22 0743            Diagnostic Studies: No results found.  Disposition: Discharge disposition: 01-Home or Self Care       Discharge Instructions     Call MD / Call 911   Complete by: As directed    If you experience chest pain or shortness of breath, CALL 911 and be transported to the hospital emergency room.  If you develope a fever above 101 F, pus (white drainage) or increased drainage or redness at the wound, or calf pain, call your surgeon's office.   Change dressing   Complete by: As directed    Maintain surgical dressing until follow up in the clinic. If the edges start to pull up, may reinforce with tape. If the dressing is no longer working, may remove and cover with gauze and tape, but must keep the area dry and clean.  Call with any questions or concerns.   Constipation Prevention   Complete by: As directed    Drink plenty of fluids.  Prune juice may be helpful.  You may use a stool softener, such as Colace (over the counter) 100 mg twice a day.  Use MiraLax (over the counter)  for constipation as needed.   Diet - low sodium heart healthy   Complete by: As directed    Increase activity slowly as tolerated   Complete by: As directed    Weight bearing as tolerated with assist device (walker, cane, etc) as directed, use it as long as suggested by your surgeon or therapist, typically at least 4-6 weeks.   Post-operative opioid taper instructions:   Complete by: As directed  POST-OPERATIVE OPIOID TAPER INSTRUCTIONS: It is important to wean off of your opioid medication as soon as possible. If you do not need pain medication after your surgery it is ok to stop day one. Opioids include: Codeine, Hydrocodone(Norco, Vicodin), Oxycodone(Percocet, oxycontin) and hydromorphone amongst others.  Long term and even short term use of opiods can cause: Increased pain response Dependence Constipation Depression Respiratory depression And more.  Withdrawal symptoms can include Flu like symptoms Nausea, vomiting And more Techniques to manage these symptoms Hydrate well Eat regular healthy meals Stay active Use relaxation techniques(deep breathing, meditating, yoga) Do Not substitute Alcohol to help with tapering If you have been on opioids for less than two weeks and do not have pain than it is ok to stop all together.  Plan to wean off of opioids This plan should start within one week post op of your joint replacement. Maintain the same interval or time between taking each dose and first decrease the dose.  Cut the total daily intake of opioids by one tablet each day Next start to increase the time between doses. The last dose that should be eliminated is the evening dose.      TED hose   Complete by: As directed    Use stockings (TED hose) for 2 weeks on both leg(s).  You may remove them at night for sleeping.        Follow-up Information     Paralee Cancel, MD. Schedule an appointment as soon as possible for a visit in 2 week(s).   Specialty: Orthopedic  Surgery Contact information: [address] [place] [address] [phone]                  Signed: Irving Copas 01/29/2022, 12:55 PM

## 2022-01-30 ENCOUNTER — Encounter: Payer: Self-pay | Admitting: Gastroenterology

## 2022-01-30 DIAGNOSIS — Z96651 Presence of right artificial knee joint: Secondary | ICD-10-CM | POA: Diagnosis not present

## 2022-01-30 DIAGNOSIS — Z471 Aftercare following joint replacement surgery: Secondary | ICD-10-CM | POA: Diagnosis not present

## 2022-01-31 ENCOUNTER — Other Ambulatory Visit: Payer: Self-pay | Admitting: Gastroenterology

## 2022-02-03 DIAGNOSIS — Z96651 Presence of right artificial knee joint: Secondary | ICD-10-CM | POA: Diagnosis not present

## 2022-02-03 DIAGNOSIS — Z471 Aftercare following joint replacement surgery: Secondary | ICD-10-CM | POA: Diagnosis not present

## 2022-02-06 DIAGNOSIS — Z471 Aftercare following joint replacement surgery: Secondary | ICD-10-CM | POA: Diagnosis not present

## 2022-02-06 DIAGNOSIS — Z96651 Presence of right artificial knee joint: Secondary | ICD-10-CM | POA: Diagnosis not present

## 2022-02-09 NOTE — Telephone Encounter (Signed)
Spoke with pt and gave pt Dr. Tarri Glenn' recommendations. Pt verbalized understanding and had no other concerns at end of call.

## 2022-02-10 DIAGNOSIS — Z471 Aftercare following joint replacement surgery: Secondary | ICD-10-CM | POA: Diagnosis not present

## 2022-02-10 DIAGNOSIS — Z96651 Presence of right artificial knee joint: Secondary | ICD-10-CM | POA: Diagnosis not present

## 2022-02-13 DIAGNOSIS — Z96651 Presence of right artificial knee joint: Secondary | ICD-10-CM | POA: Diagnosis not present

## 2022-02-13 DIAGNOSIS — Z471 Aftercare following joint replacement surgery: Secondary | ICD-10-CM | POA: Diagnosis not present

## 2022-02-17 DIAGNOSIS — Z471 Aftercare following joint replacement surgery: Secondary | ICD-10-CM | POA: Diagnosis not present

## 2022-02-17 DIAGNOSIS — Z96651 Presence of right artificial knee joint: Secondary | ICD-10-CM | POA: Diagnosis not present

## 2022-02-20 DIAGNOSIS — Z96651 Presence of right artificial knee joint: Secondary | ICD-10-CM | POA: Diagnosis not present

## 2022-02-20 DIAGNOSIS — Z471 Aftercare following joint replacement surgery: Secondary | ICD-10-CM | POA: Diagnosis not present

## 2022-02-24 ENCOUNTER — Other Ambulatory Visit: Payer: BC Managed Care – PPO

## 2022-02-24 ENCOUNTER — Encounter: Payer: Self-pay | Admitting: Gastroenterology

## 2022-02-24 ENCOUNTER — Ambulatory Visit (INDEPENDENT_AMBULATORY_CARE_PROVIDER_SITE_OTHER): Payer: BC Managed Care – PPO | Admitting: Gastroenterology

## 2022-02-24 VITALS — BP 98/58 | HR 47 | Ht 70.0 in | Wt 188.0 lb

## 2022-02-24 DIAGNOSIS — R1010 Upper abdominal pain, unspecified: Secondary | ICD-10-CM

## 2022-02-24 DIAGNOSIS — R932 Abnormal findings on diagnostic imaging of liver and biliary tract: Secondary | ICD-10-CM

## 2022-02-24 DIAGNOSIS — K2100 Gastro-esophageal reflux disease with esophagitis, without bleeding: Secondary | ICD-10-CM | POA: Diagnosis not present

## 2022-02-24 DIAGNOSIS — K760 Fatty (change of) liver, not elsewhere classified: Secondary | ICD-10-CM

## 2022-02-24 DIAGNOSIS — Z471 Aftercare following joint replacement surgery: Secondary | ICD-10-CM | POA: Diagnosis not present

## 2022-02-24 DIAGNOSIS — Z96651 Presence of right artificial knee joint: Secondary | ICD-10-CM | POA: Diagnosis not present

## 2022-02-24 NOTE — Patient Instructions (Addendum)
It was a pleasure to see you today.  I am so glad that you are feeling better.  Please stop in the lab today for a test to better understand your fatty liver.   Avoid (or at least minimize) specific foods known to trigger reflux. These include but are not limited to fried foods, high-fat content foods, spicy foods, tomato-based foods, diary products, caffeine, chocolate, and alcohol.   After 10 weeks of pantoprazole twice daily, reduce to 40 mg every morning. If after one week, you are not experiencing any abdominal pain or reflux, reduce to 40 mg every other morning for one week before discontinuing the medication. If you symptoms return at any time, resume the prior dose.   I recommend that you work to decrease stress, decreased weight, and avoidance of late night eating may also be beneficial. I recommend staying upright for 2-3 hours after eating.   Unfortunately, there are no FDA-approved medications for fatty liver disease. The good news is that the most effective treatment so far for fatty liver disease does not involve medications, but rather lifestyle changes. The bad news is that these are typically hard to achieve and maintain for many people. Here's what we know helps:  - Work to maintain a health weight weight.  - It appears that aerobic exercise also leads to decreased fat in the liver, and with vigorous intensity, possibly also decreased inflammation independent of weight loss. - Eat well. Some studies suggest that the Mediterranean diet may also decrease the fat in the liver. This nutrition plan emphasizes fruits, vegetables, whole grains, legumes, nuts, replacing butter with olive or canola oil, limiting red meat, and eating more fish and lean poultry. - Drink coffee, maybe? Some studies showed that patients with NAFLD who drank coffee (about two cups every day) had a decreased risk in fibrosis. However, take into consideration the downsides of regular caffeine intake.  You should  plan to have a colonoscopy in 2027, or earlier with new symptoms.   Please let me know if you need anything in the future.

## 2022-02-24 NOTE — Progress Notes (Signed)
Referring Provider: Kathyrn Lass, MD Primary Care Physician:  Kathyrn Lass, MD  Chief Complaint: Follow-up after endoscopy   IMPRESSION:  LA Class B reflux esophagitis with a small hiatal hernia. Symptoms improving on pantoprazole 40 mg BID.   Abdominal pain. Likely due to reflux with additional evaluation non-diagnostic including abdominal ultrasound, MRI, and HIDA. Resolved with PPI BID.  Hyperplastic gastric polyp. No endoscopic follow-up indicated.   Fatty liver on imaging with normal liver enzymes and platelets. On Lipitor. Reviewed natural history of treatment of fatty liver. We discussed a NASH Fibrosure as a starting point.   History of colon polyps. Polyp removed in 2020. Pathology unknown. Surveillance colonoscopy due 2027.   PLAN: - Continue pantoprazole 40 mg BID for 10 weeks, then reduce to 40 mg QAM - Reflux lifestyle modifications - Colonoscopy 2027 - NASH FibroSURE - See patient instructions for additional lifestyle recommendations - Annual follow-up if the NASH FibroSURE is reassuring  HPI: Kevin Reyes is a 67 y.o. male who returns in follow-up after endoscopic evaluation of abdominal pain.   Previously seen by Dr. Cristina Gong for reflux, colon cancer screening, and abdominal pain.  Last office visit with Edwyna Perfect 08/19/2021.  He has a history of hypercholesterolemia, seasonal allergies, tension headaches, and anxiety. He had Covid in June. He is a Manufacturing systems engineer.   Developed intermittent abdominal cramping in April with bloating.  Symptoms are predominantly postprandially and occur most frequently with spicy or greasy foods. Cramping occurs one hour after eating and resolve within an hour.  Symptoms are adequately controlled on a bland diet.  There is no associated nausea, vomiting, change in bowel habits, weight loss, blood in the stool.  Initially improved with empiric ondansetron provided by his  PCP. He finds that lying down and relaxing provides  relief.   Labs 08/19/2021 show a normal comprehensive metabolic panel and normal CBC  Abdominal ultrasound 08/21/2021 showed echogenic liver.  Within the right hepatic lobe there is a most focal area of increased echogenicity which may represent steatosis.  No gallstones.  No acute cholecystitis.  MRI with and without contrast for abdominal pain 09/03/2021 showed moderate to marked fatty steatosis with geographic areas of variable fat deposition accounting for abnormality seen on previous ultrasound.  No suspicious hepatic lesion.  No acute abdominal findings.  HIDA scan 11/17/21 was normal with a GBEF of 47%  EGD 12/18/21 showed LA Grade B reflux esophagitis, a gastric polyp just distal to the GE junction with biopsies showing hyperplastic gastric polyp, gastritis, a small hiatal hernia, and duodenitis. Gastric and duodenal biopsies were normal.   He returns today in follow-up on pantoprazole 40 mg BID with nearly complete resolution of his symptoms.  Required some NSAIDs after left knee surgery but is now only using acetaminophen. GI ROS is otherwise negative.   He works out regularly with both weights and aerobic exericise.  Endoscopic history: - EGD 07/2009 for epigastric pain was normal except for small hiatal hernia, symptoms different at that time - Colonoscopy 08/01/2018 showed 1 tubular adenoma in the transverse colon, mild sigmoid, diverticulosis. Pathology results are unknown to me.  - EGD 12/18/21 showed LA Grade B reflux esophagitis, a gastric polyp just distal to the GE junction, gastritis, a small haital hernia, and duodenitis.   Maternal aunt with colon cancer in her 22s. There is no other known family history of colon cancer or polyps. No family history of stomach cancer or other GI malignancy. No family history of inflammatory bowel disease  or celiac.    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     Past Surgical History:  Procedure Laterality Date   COLONOSCOPY     DRUG INDUCED ENDOSCOPY     KNEE ARTHROSCOPY Right    PARTIAL KNEE ARTHROPLASTY Right 01/20/2022   Procedure: UNICOMPARTMENTAL KNEE MEDIALLY;  Surgeon: Paralee Cancel, MD;  Location: [facility];  Service: Orthopedics;  Laterality: Right;   US ECHOCARDIOGRAPHY  05/08/2008   EF 55-60%   US ECHOCARDIOGRAPHY  08/23/2002   EF 60-65%      Current Outpatient Medications  Medication Sig Dispense Refill   atorvastatin (LIPITOR) 20 MG tablet TAKE 1 TABLET BY MOUTH DAILY AT 6 PM. PLEASE MAKE OVERDUE APPT BEFORE ANYMORE REFILLS 90 tablet 1   busPIRone (BUSPAR) 30 MG tablet Take 10 mg by mouth in the morning and at bedtime.     finasteride (PROSCAR) 5 MG tablet Take 5 mg by mouth daily.  3   gabapentin (NEURONTIN) 300 MG capsule Take 600 mg by mouth at bedtime.     methocarbamol (ROBAXIN) 500 MG tablet Take 1 tablet (500 mg total) by mouth every 6 (six) hours as needed for muscle spasms. 40 tablet 1   oxyCODONE (ROXICODONE) 5 MG immediate release tablet Take 1 tablet (5 mg total) by mouth every 4 (four) hours as needed for severe pain. 42 tablet 0   pantoprazole (PROTONIX) 40 MG tablet Take 1 tablet (40 mg total) by mouth 2 (two) times daily. 90 tablet 3   propranolol (INDERAL) 20 MG tablet Take 20 mg by mouth 3 (three) times daily.     sildenafil (VIAGRA) 100 MG tablet 1 tablet as needed     Varenicline Tartrate (TYRVAYA NA) Place 1 drop into both eyes in the morning and at bedtime.     dicyclomine (BENTYL) 10 MG capsule TAKE 1 CAPSULE (10 MG TOTAL) BY MOUTH 4 TIMES A DAY BEFORE MEALS AND AT BEDTIME (Patient not taking: Reported on 02/24/2022) 360 capsule 1    polyethylene glycol (MIRALAX / GLYCOLAX) 17 g packet Take 17 g by mouth 2 (two) times daily. (Patient not taking: Reported on 02/24/2022) 14 each 0   No current facility-administered medications for this visit.    Allergies as of 02/24/2022   (No Known Allergies)    Family History  Problem Relation Age of Onset   Hypertension Mother    Sudden death Neg Hx    Hyperlipidemia Neg Hx    Heart attack Neg Hx    Diabetes Neg Hx       Physical Exam: Gen: Awake, alert, and oriented, and well communicative. HEENT: EOMI, non-icteric sclera, NCAT, MMM  Neck: Normal movement of head and neck  Pulm: No labored breathing, speaking in full sentences without conversational dyspnea  Derm: No apparent lesions or bruising in visible field  MS: Moves all visible extremities without noticeable abnormality  Psych: Pleasant, cooperative, normal speech, thought processing seemingly intact      Phylis Javed L. Tarri Glenn, MD, MPH 02/24/2022, 8:33 AM

## 2022-02-26 LAB — NASH FIBROSURE(R) PLUS
ALPHA 2-MACROGLOBULINS, QN: 149 mg/dL (ref 110–276)
ALT (SGPT) P5P: 23 IU/L (ref 0–55)
AST (SGOT) P5P: 26 IU/L (ref 0–40)
Apolipoprotein A-1: 142 mg/dL (ref 101–178)
Bilirubin, Total: 0.4 mg/dL (ref 0.0–1.2)
Cholesterol, Total: 155 mg/dL (ref 100–199)
Fibrosis Score: 0.2 (ref 0.00–0.21)
GGT: 37 IU/L (ref 0–65)
Glucose: 76 mg/dL (ref 70–99)
Haptoglobin: 122 mg/dL (ref 32–363)
NASH Score: 0.62 — ABNORMAL HIGH (ref 0.00–0.25)
Steatosis Score: 0.5 — ABNORMAL HIGH (ref 0.00–0.40)
Triglycerides: 246 mg/dL — ABNORMAL HIGH (ref 0–149)

## 2022-02-27 ENCOUNTER — Encounter: Payer: Self-pay | Admitting: Gastroenterology

## 2022-02-27 DIAGNOSIS — Z96651 Presence of right artificial knee joint: Secondary | ICD-10-CM | POA: Diagnosis not present

## 2022-02-27 DIAGNOSIS — Z471 Aftercare following joint replacement surgery: Secondary | ICD-10-CM | POA: Diagnosis not present

## 2022-03-02 DIAGNOSIS — Z471 Aftercare following joint replacement surgery: Secondary | ICD-10-CM | POA: Diagnosis not present

## 2022-03-02 DIAGNOSIS — Z96651 Presence of right artificial knee joint: Secondary | ICD-10-CM | POA: Diagnosis not present

## 2022-03-04 DIAGNOSIS — Z471 Aftercare following joint replacement surgery: Secondary | ICD-10-CM | POA: Diagnosis not present

## 2022-03-04 DIAGNOSIS — Z96651 Presence of right artificial knee joint: Secondary | ICD-10-CM | POA: Diagnosis not present

## 2022-03-19 DIAGNOSIS — Z0000 Encounter for general adult medical examination without abnormal findings: Secondary | ICD-10-CM | POA: Diagnosis not present

## 2022-03-21 ENCOUNTER — Encounter: Payer: Self-pay | Admitting: Gastroenterology

## 2022-04-15 DIAGNOSIS — F411 Generalized anxiety disorder: Secondary | ICD-10-CM | POA: Diagnosis not present

## 2022-04-25 ENCOUNTER — Other Ambulatory Visit: Payer: Self-pay | Admitting: Cardiovascular Disease

## 2022-04-25 DIAGNOSIS — I341 Nonrheumatic mitral (valve) prolapse: Secondary | ICD-10-CM

## 2022-04-25 DIAGNOSIS — E782 Mixed hyperlipidemia: Secondary | ICD-10-CM

## 2022-04-28 ENCOUNTER — Other Ambulatory Visit: Payer: Self-pay | Admitting: Gastroenterology

## 2022-04-28 DIAGNOSIS — K2980 Duodenitis without bleeding: Secondary | ICD-10-CM

## 2022-04-28 DIAGNOSIS — K259 Gastric ulcer, unspecified as acute or chronic, without hemorrhage or perforation: Secondary | ICD-10-CM

## 2022-05-05 ENCOUNTER — Ambulatory Visit: Payer: BC Managed Care – PPO | Attending: Physician Assistant | Admitting: Cardiology

## 2022-05-05 ENCOUNTER — Encounter: Payer: Self-pay | Admitting: Gastroenterology

## 2022-05-05 ENCOUNTER — Encounter: Payer: Self-pay | Admitting: Physician Assistant

## 2022-05-05 ENCOUNTER — Ambulatory Visit (INDEPENDENT_AMBULATORY_CARE_PROVIDER_SITE_OTHER): Payer: BC Managed Care – PPO | Admitting: Gastroenterology

## 2022-05-05 ENCOUNTER — Other Ambulatory Visit: Payer: BC Managed Care – PPO

## 2022-05-05 ENCOUNTER — Other Ambulatory Visit: Payer: Self-pay | Admitting: Gastroenterology

## 2022-05-05 VITALS — BP 108/72 | HR 57 | Ht 70.0 in | Wt 190.2 lb

## 2022-05-05 VITALS — BP 108/58 | HR 47 | Ht 71.0 in | Wt 189.0 lb

## 2022-05-05 DIAGNOSIS — R1084 Generalized abdominal pain: Secondary | ICD-10-CM | POA: Diagnosis not present

## 2022-05-05 DIAGNOSIS — E782 Mixed hyperlipidemia: Secondary | ICD-10-CM

## 2022-05-05 DIAGNOSIS — I341 Nonrheumatic mitral (valve) prolapse: Secondary | ICD-10-CM

## 2022-05-05 DIAGNOSIS — K760 Fatty (change of) liver, not elsewhere classified: Secondary | ICD-10-CM

## 2022-05-05 DIAGNOSIS — K449 Diaphragmatic hernia without obstruction or gangrene: Secondary | ICD-10-CM | POA: Diagnosis not present

## 2022-05-05 DIAGNOSIS — K580 Irritable bowel syndrome with diarrhea: Secondary | ICD-10-CM

## 2022-05-05 DIAGNOSIS — K219 Gastro-esophageal reflux disease without esophagitis: Secondary | ICD-10-CM

## 2022-05-05 MED ORDER — HYOSCYAMINE SULFATE 0.125 MG SL SUBL: 0.1250 mg | SUBLINGUAL_TABLET | Freq: Four times a day (QID) | SUBLINGUAL | 2 refills | Status: DC | PRN

## 2022-05-05 MED ORDER — ATORVASTATIN CALCIUM 20 MG PO TABS
ORAL_TABLET | ORAL | 3 refills | Status: DC
Start: 2022-05-05 — End: 2023-05-06

## 2022-05-05 MED ORDER — PANTOPRAZOLE SODIUM 40 MG PO TBEC: 40.0000 mg | DELAYED_RELEASE_TABLET | Freq: Two times a day (BID) | ORAL | 3 refills | Status: DC

## 2022-05-05 MED ORDER — FAMOTIDINE 40 MG PO TABS: 40.0000 mg | ORAL_TABLET | Freq: Every day | ORAL | 3 refills | Status: DC

## 2022-05-05 NOTE — Progress Notes (Signed)
Chief Complaint:   Referring Provider:  Sigmund Hazel, MD      ASSESSMENT AND PLAN;   LA Class B reflux esophagitis with a small hiatal hernia.  Having nocturnal symptoms despite pantoprazole 40 mg BID.    IBS-D. Abdominal pain. Neg [place], MRI, and HIDA.  Associated anxiety.   Hyperplastic gastric polyp. No endoscopic follow-up indicated.    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 NASH Fibrosure-02/24/2022-F0 (no fibrosis), S1 (mild steatosis).  Nash score 0.62/N2-moderate NASH   H/O colon polyps. Polyp(TA) removed in 2020. Surveillance colonoscopy due 2027.    Plan: -GES -Continue protonix 40BID -Add pepcid  po QHS -Celiac serology, alpha gal -Levsin 0.125mg  Q6hrs prn #90, 2RF -If still with problems and above WU is neg, would proceed with CT scan abdo/pelvis followed by colon. -Encouraged weight loss. -FU in Aug 2024.  Earlier, if needed    HPI:    Kevin Reyes is a 67 y.o. male  Gen anxiety disorder ([facility]ed at [facility]- lexapro/prn klonopin), HLD, fatty liver, GERD Previous patient of Dr. Orvan Falconer  Here for follow-up.  Still experiencing reflux especially at night despite Protonix twice daily.  Also gets exacerbated when he is under stress.  Along with lower abdominal crampy pain at night.  Intermittent diarrhea especially after eating.  No melena or hematochezia.  Symptoms are somewhat better ever since he has been started on Lexapro and as needed Klonopin.  No weight loss.  Denies having any nocturnal symptoms.  Wt Readings from Last 3 Encounters:  05/05/22 190 lb 4 oz (86.3 kg)  02/24/22 188 lb (85.3 kg)  01/20/22 192 lb (87.1 kg)    Past GI workup: MRI Liver with contrast 09/04/2021 -Moderate to marked fatty steatosis with geographic areas of variable fat deposition accounting for abnormality seen on previous ultrasound. No suspicious hepatic lesion.  NASH FibroSure 02/24/2022-F0 (no fibrosis), S1 (mild steatosis).  Nash score  0.62/N2-moderate NASH  [place] abdo 08/2021 -No cholelithiasis or sonographic evidence for acute cholecystitis. -The liver is increased in echogenicity suggestive of steatosis.  HIDA with EF: neg  Endoscopic history: - EGD 12/18/2021: LA Grade B reflux esophagitis with no bleeding. Biopsied. - Polyp immediately distal to the GE junction. Biopsied. - Erythematous mucosa in the antrum. Biopsied. - Small hiatal hernia. - Erythematous duodenopathy. Biopsied. Bx- neg for celiac disease, HP.  Positive for reflux.  Polyp-hyperplastic.  - EGD 07/2009 for epigastric pain was normal except for small hiatal hernia, symptoms different at that time - Colonoscopy 08/01/2018 showed 1 tubular adenoma in the transverse colon, mild sigmoid, diverticulosis.   SH: Marine scientist.  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     Past Surgical History:  Procedure Laterality Date   COLONOSCOPY     DRUG INDUCED ENDOSCOPY     KNEE ARTHROSCOPY Right    PARTIAL KNEE ARTHROPLASTY Right 01/20/2022   Procedure: UNICOMPARTMENTAL KNEE MEDIALLY;  Surgeon: Durene Romans, MD;  Location: [facility];  Service: Orthopedics;  Laterality: Right;   US  ECHOCARDIOGRAPHY  05/08/2008   EF 55-60%   US ECHOCARDIOGRAPHY  08/23/2002   EF 60-65%    Family History  Problem Relation Age of Onset   Hypertension Mother    Colon cancer Maternal Aunt    Sudden death Neg Hx    Hyperlipidemia Neg Hx    Heart attack Neg Hx    Diabetes Neg Hx    Rectal cancer Neg Hx    Stomach cancer Neg Hx    Esophageal cancer Neg Hx    Liver cancer Neg Hx    Pancreatic cancer Neg Hx     Social History    Tobacco Use   Smoking status: Never   Smokeless tobacco: Never  Vaping Use   Vaping Use: Never used  Substance Use Topics   Alcohol use: Yes    Alcohol/week: 4.0 standard drinks of alcohol    Types: 4 Shots of liquor per week    Comment: social use   Drug use: No    Current Outpatient Medications  Medication Sig Dispense Refill   atorvastatin (LIPITOR) 20 MG tablet TAKE 1 TABLET BY MOUTH DAILY AT 6 PM. 90 tablet 2   busPIRone (BUSPAR) 30 MG tablet Take 10 mg by mouth in the morning and at bedtime.     clonazePAM (KLONOPIN) 0.5 MG tablet Take 0.5 mg by mouth as needed.     escitalopram (LEXAPRO) 5 MG tablet Take 5 mg by mouth at bedtime.     finasteride (PROSCAR) 5 MG tablet Take 5 mg by mouth daily.  3   gabapentin (NEURONTIN) 300 MG capsule Take 600 mg by mouth at bedtime.     pantoprazole (PROTONIX) 40 MG tablet TAKE 1 TABLET BY MOUTH TWICE A DAY 180 tablet 1   propranolol (INDERAL) 20 MG tablet Take 20 mg by mouth 3 (three) times daily.     sildenafil (VIAGRA) 100 MG tablet 1 tablet as needed     Varenicline Tartrate (TYRVAYA NA) Place 1 drop into both eyes in the morning and at bedtime.     No current facility-administered medications for this visit.    No Known Allergies  Review of Systems:  Has anxiety/depression     Physical Exam:    BP 108/72   Pulse (!) 57   Ht  (1.778 m)   Wt 190 lb 4 oz (86.3 kg)   SpO2 98%   BMI 27.30 kg/m  Wt Readings from Last 3 Encounters:  05/05/22 190 lb 4 oz (86.3 kg)  02/24/22 188 lb (85.3 kg)  01/20/22 192 lb (87.1 kg)   Constitutional:  Well-developed, in no acute distress. Psychiatric: Normal mood and affect. Behavior is normal. HEENT: Pupils normal.  Conjunctivae are normal. No scleral icterus. Cardiovascular: Normal rate, regular rhythm. No edema Pulmonary/chest: Effort normal and breath sounds normal. No wheezing, rales or rhonchi. Abdominal: Soft, nondistended. Nontender. Bowel sounds active throughout. There  are no masses palpable. No hepatomegaly. Rectal: Deferred Neurological: Alert and oriented to person place and time. Skin: Skin is warm and dry. No rashes noted.  Data Reviewed: I have personally reviewed following labs and imaging studies  CBC:    Latest Ref Rng & Units 01/21/2022    7:26 AM 01/14/2022    2:46 PM  CBC  WBC 4.0 - 10.5 K/uL 23.4  9.0   Hemoglobin 13.0 - 17.0 g/dL 19.1  47.8   Hematocrit 39.0 - 52.0 % 40.7  44.4   Platelets 150 - 400 K/uL 249  245  CMP:    Latest Ref Rng & Units 01/21/2022    7:26 AM 01/14/2022    2:46 PM 02/13/2021    4:30 PM  CMP  Glucose 70 - 99 mg/dL 130  99  84   BUN 8 - 23 mg/dL 26  19  17    Creatinine 0.61 - 1.24 mg/dL 8.65  7.84  6.96   Sodium 135 - 145 mmol/L 136  138  140   Potassium 3.5 - 5.1 mmol/L 4.4  4.4  4.5   Chloride 98 - 111 mmol/L 106  104  104   CO2 22 - 32 mmol/L 23  27  24    Calcium 8.9 - 10.3 mg/dL 8.8  8.9  9.3   ALT 0 - 44 IU/L   25        Edman Circle, MD 05/05/2022, 9:40 AM  Cc: Sigmund Hazel, MD

## 2022-05-05 NOTE — Progress Notes (Signed)
Cardiology Office Note:    Date:  05/05/2022   ID:  Kevin Reyes, DOB 12-12-1955, MRN [id number]  PCP:  Sigmund Hazel, MD   [facility] Providers Cardiologist:  Kristeen Miss, MD     Referring MD: Sigmund Hazel, MD   Chief Complaint  Patient presents with   Follow-up    Mild Mitral Valve Regurgitation      History of Present Illness:    Kevin Reyes is a 67 y.o. male with a hx of mitral valve prolapse, hyperlipidemia, anxiety, GERD.  Patient is followed by Dr. Elease Hashimoto and presents today for his annual follow-up  Patient has been followed by Dr. Elease Hashimoto for several years.  In 2010, patient had an echocardiogram that showed minimal prolapse of the posterior leaflet of the mitral valve with mild mitral valve regurgitation.  Does not appear that patient has had an echocardiogram since that time.  Patient was last seen by cardiology on 02/13/2021.  At that time, patient was overall doing well.  He was not able to jog anymore because of knee issues. He was admitted to [facility] from 1/9-1/10 for a right unicompartmental knee replacement.   Today, patient reports that he has been doing very well from a cardiovascular standpoint. He had knee surgery a few months ago, and has been able to increase his physical activity well. He exercises about 6 days a week and does a combination of strength training and cardio. He denies chest pain or excessive shortness of breath with exertion. Denies orthopnea, ankle edema, palpitations, dizziness, syncope, near syncope.   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     Past Surgical  History:  Procedure Laterality Date   COLONOSCOPY     DRUG INDUCED ENDOSCOPY     KNEE ARTHROSCOPY Right    PARTIAL KNEE ARTHROPLASTY Right 01/20/2022   Procedure: UNICOMPARTMENTAL KNEE MEDIALLY;  Surgeon: Durene Romans, MD;  Location: [facility];  Service: Orthopedics;  Laterality: Right;   US ECHOCARDIOGRAPHY  05/08/2008   EF 55-60%   US ECHOCARDIOGRAPHY  08/23/2002   EF 60-65%    Current Medications: Current Meds  Medication Sig   busPIRone (BUSPAR) 10 MG tablet Take 10 mg by mouth 3 (three) times daily.   clonazePAM (KLONOPIN) 0.5 MG tablet Take 0.5 mg by mouth as needed for anxiety.   escitalopram (LEXAPRO) 5 MG tablet Take 5 mg by mouth at bedtime.   famotidine (PEPCID) 40 MG tablet Take 1 tablet (40 mg total) by mouth at bedtime.   finasteride (PROSCAR) 5 MG tablet Take 5 mg by mouth daily.   gabapentin (NEURONTIN) 300 MG capsule Take 600 mg by mouth at bedtime.   hyoscyamine (LEVSIN SL) 0.125 MG SL tablet Place 1 tablet (0.125 mg total) under the tongue every 6 (six) hours as needed.   pantoprazole (PROTONIX) 40 MG tablet TAKE 1 TABLET BY MOUTH TWICE A DAY   propranolol (INDERAL) 20 MG tablet Take 20 mg by mouth daily.   sildenafil (VIAGRA) 100 MG tablet Take 100  mg by mouth as needed for erectile dysfunction.   Varenicline Tartrate (TYRVAYA NA) Place 1 drop into both eyes in the morning and at bedtime.   [DISCONTINUED] atorvastatin (LIPITOR) 20 MG tablet TAKE 1 TABLET BY MOUTH DAILY AT 6 PM.     Allergies:   Patient has no known allergies.   Social History   Socioeconomic History   Marital status: Married    Spouse name: Not on file   Number of children: Not on file   Years of education: Not on file   Highest education level: Not on file  Occupational History   Not on file  Tobacco Use   Smoking status: Never   Smokeless tobacco: Never  Vaping Use   Vaping Use: Never used  Substance and Sexual Activity   Alcohol use: Yes    Alcohol/week: 4.0 standard drinks of  alcohol    Types: 4 Shots of liquor per week    Comment: social use   Drug use: No   Sexual activity: Not on file  Other Topics Concern   Not on file  Social History Narrative   Not on file   Social Determinants of Health   Financial Resource Strain: Not on file  Food Insecurity: No Food Insecurity (01/20/2022)   Hunger Vital Sign    Worried About Running Out of Food in the Last Year: Never true    Ran Out of Food in the Last Year: Never true  Transportation Needs: No Transportation Needs (01/20/2022)   PRAPARE - Administrator, Civil Service (Medical): No    Lack of Transportation (Non-Medical): No  Physical Activity: Not on file  Stress: Not on file  Social Connections: Not on file     Family History: The patient's family history includes Colon cancer in his maternal aunt; Hypertension in his mother. There is no history of Sudden death, Hyperlipidemia, Heart attack, Diabetes, Rectal cancer, Stomach cancer, Esophageal cancer, Liver cancer, or Pancreatic cancer.  ROS:   Please see the history of present illness.    All other systems reviewed and are negative.  EKGs/Labs/Other Studies Reviewed:    The following studies were reviewed today:  Echocardiogram 05/08/2008   EKG:  EKG is ordered today.  The ekg ordered today demonstrates sinus bradycardia with HR 47 BPM.   Recent Labs: 01/21/2022: BUN 26; Creatinine, Ser 1.17; Hemoglobin 13.8; Platelets 249; Potassium 4.4; Sodium 136  Recent Lipid Panel    Component Value Date/Time   CHOL 155 02/24/2022 0911   TRIG 246 (H) 02/24/2022 0911   HDL 45 02/13/2021 1630   CHOLHDL 3.4 02/13/2021 1630   CHOLHDL 3 05/09/2014 0758   VLDL 18.8 05/09/2014 0758   LDLCALC 76 02/13/2021 1630     Risk Assessment/Calculations:                Physical Exam:    VS:  BP (!) 108/58   Pulse (!) 47   Ht  (1.803 m)   Wt 189 lb (85.7 kg)   SpO2 97%   BMI 26.36 kg/m     Wt Readings from Last 3 Encounters:  05/05/22  189 lb (85.7 kg)  05/05/22 190 lb 4 oz (86.3 kg)  02/24/22 188 lb (85.3 kg)     GEN:  Well nourished, well developed in no acute distress. Sitting comfortably on the exam table.  HEENT: Normal NECK: No JVD CARDIAC: Regular rhythm, bradycardic. no murmurs, rubs, gallops. Radial pulses 2+ bilaterally  RESPIRATORY:  Clear to auscultation  without rales, wheezing or rhonchi. Normal work of breathing on room air  ABDOMEN: Soft, non-tender, non-distended MUSCULOSKELETAL:  No edema in BLE; No deformity  SKIN: Warm and dry NEUROLOGIC:  Alert and oriented x 3 PSYCHIATRIC:  Normal affect   ASSESSMENT:    1. MVP (mitral valve prolapse)   2. Mixed hyperlipidemia    PLAN:    In order of problems listed above:  Mild Mitral Valve Regurgitation  -Echocardiogram from 2010 showed minimal prolapse of the posterior leaflet of the mitral valve with mild mitral valve regurgitation -Patient has been doing very well from a cardiovascular standpoint since that time and echocardiogram has not been repeated - Patient continues to do well from a cardiovascular standpoint. He has been exercising regularly without excessive shortness of breath. No orthopnea, cough, ankle edema  - His MR sounds very stable and he is asymptomatic. Will not repeat echo at this time   HLD  - Most Recent lipid panel from 02/2021 showed LD 76, Hdl 45, triglycerides 246, total cholesterol 155 - Continue lipitor 20 mg daily  - Ordered repeat lipid panel. Patient has already eaten today, so he will get labs drawn in the next few days     Medication Adjustments/Labs and Tests Ordered: Current medicines are reviewed at length with the patient today.  Concerns regarding medicines are outlined above.  Orders Placed This Encounter  Procedures   Lipid panel   EKG 12-Lead   Meds ordered this encounter  Medications   atorvastatin (LIPITOR) 20 MG tablet    Sig: TAKE 1 TABLET BY MOUTH DAILY AT 6 PM.    Dispense:  90 tablet    Refill:   3    Patient Instructions  Medication Instructions:  Your physician recommends that you continue on your current medications as directed. Please refer to the Current Medication list given to you today.  *If you need a refill on your cardiac medications before your next appointment, please call your pharmacy*   Lab Work: 05/13/22, COME TO THE LAB, FASTING, ANYTIME AFTER 7:15 FOR:  LIPID  If you have labs (blood work) drawn today and your tests are completely normal, you will receive your results only by: MyChart Message (if you have MyChart) OR A paper copy in the mail If you have any lab test that is abnormal or we need to change your treatment, we will call you to review the results.   Testing/Procedures: None ordered   Follow-Up: At [facility], you and your health needs are our priority.  As part of our continuing mission to provide you with exceptional heart care, we have created designated Provider Care Teams.  These Care Teams include your primary Cardiologist (physician) and Advanced Practice Providers (APPs -  Physician Assistants and Nurse Practitioners) who all work together to provide you with the care you need, when you need it.  We recommend signing up for the patient portal called "MyChart".  Sign up information is provided on this After Visit Summary.  MyChart is used to connect with patients for Virtual Visits (Telemedicine).  Patients are able to view lab/test results, encounter notes, upcoming appointments, etc.  Non-urgent messages can be sent to your provider as well.   To learn more about what you can do with MyChart, go to [email or web address].    Your next appointment:   12 month(s)  Provider:   Kristeen Miss, MD     Other Instructions     Signed, Jonita Albee, PA-C  05/05/2022 4:35  PM    [place]

## 2022-05-05 NOTE — Patient Instructions (Signed)
Medication Instructions:  Your physician recommends that you continue on your current medications as directed. Please refer to the Current Medication list given to you today.  *If you need a refill on your cardiac medications before your next appointment, please call your pharmacy*   Lab Work: 05/13/22, COME TO THE LAB, FASTING, ANYTIME AFTER 7:15 FOR:  LIPID  If you have labs (blood work) drawn today and your tests are completely normal, you will receive your results only by: MyChart Message (if you have MyChart) OR A paper copy in the mail If you have any lab test that is abnormal or we need to change your treatment, we will call you to review the results.   Testing/Procedures: None ordered   Follow-Up: At [facility], you and your health needs are our priority.  As part of our continuing mission to provide you with exceptional heart care, we have created designated Provider Care Teams.  These Care Teams include your primary Cardiologist (physician) and Advanced Practice Providers (APPs -  Physician Assistants and Nurse Practitioners) who all work together to provide you with the care you need, when you need it.  We recommend signing up for the patient portal called "MyChart".  Sign up information is provided on this After Visit Summary.  MyChart is used to connect with patients for Virtual Visits (Telemedicine).  Patients are able to view lab/test results, encounter notes, upcoming appointments, etc.  Non-urgent messages can be sent to your provider as well.   To learn more about what you can do with MyChart, go to [email or web address].    Your next appointment:   12 month(s)  Provider:   Kristeen Miss, MD     Other Instructions

## 2022-05-05 NOTE — Patient Instructions (Addendum)
_______________________________________________________  If your blood pressure at your visit was 140/90 or greater, please contact your primary care physician to follow up on this.  _______________________________________________________  If you are age 67 or older, your body mass index should be between 23-30. Your Body mass index is 27.3 kg/m. If this is out of the aforementioned range listed, please consider follow up with your Primary Care Provider.  If you are age 49 or younger, your body mass index should be between 19-25. Your Body mass index is 27.3 kg/m. If this is out of the aformentioned range listed, please consider follow up with your Primary Care Provider.   ________________________________________________________  The [facility] providers would like to encourage you to use [facility] to communicate with providers for non-urgent requests or questions.  Due to long hold times on the telephone, sending your provider a message by [facility] may be a faster and more efficient way to get a response.  Please allow 48 business hours for a response.  Please remember that this is for non-urgent requests.  _______________________________________________________  Your provider has requested that you go to the basement level for lab work before leaving today. Press "B" on the elevator. The lab is located at the first door on the left as you exit the elevator.  We have sent the following medications to your pharmacy for you to pick up at your convenience: Protonix 2 times a day Pepcid at night  Levsin every 6 hours as needed  You have been scheduled for an appointment with Dr. Chales Abrahams on 09-08-2022 at 930am . Please arrive 10 minutes early for your appointment.  You have been scheduled for a gastric emptying scan at [facility] on May 29, 2022 at 730am. Please arrive at least 30 minutes prior to your appointment for registration. Please make certain not to have anything to eat or drink  after midnight the night before your test. Hold all stomach medications (ex: Zofran, phenergan, Reglan) 24 hours prior to your test. If you need to reschedule your appointment, please contact radiology scheduling at [phone]. _____________________________________________________________________ A gastric-emptying study measures how long it takes for food to move through your stomach. There are several ways to measure stomach emptying. In the most common test, you eat food that contains a small amount of radioactive material. A scanner that detects the movement of the radioactive material is placed over your abdomen to monitor the rate at which food leaves your stomach. This test normally takes about 4 hours to complete. _____________________________________________________________________

## 2022-05-05 NOTE — Telephone Encounter (Signed)
Can I see about a PA for this please? The levsin

## 2022-05-06 LAB — CELIAC PANEL 10: IgA/Immunoglobulin A, Serum: 125 mg/dL (ref 61–437)

## 2022-05-07 LAB — CELIAC PANEL 10

## 2022-05-09 LAB — ALPHA-GAL PANEL
Allergen, Mutton, f88: <0.1 kU/L
Allergen, Pork, f26: <0.1 kU/L
Beef: <0.1 kU/L
CLASS: 0
CLASS: 0
Class: 0
GALACTOSE-ALPHA-1,3-GALACTOSE IGE*: 0.21 kU/L — ABNORMAL HIGH (ref <0.10)

## 2022-05-09 LAB — INTERPRETATION:

## 2022-05-11 ENCOUNTER — Encounter: Payer: Self-pay | Admitting: Gastroenterology

## 2022-05-11 LAB — CELIAC PANEL 10
Gliadin IgG: 10 units (ref 0–19)
Tissue Transglut Ab: 6 U/mL — ABNORMAL HIGH (ref 0–5)

## 2022-05-13 ENCOUNTER — Ambulatory Visit: Payer: BC Managed Care – PPO | Attending: Family Medicine

## 2022-05-13 DIAGNOSIS — F411 Generalized anxiety disorder: Secondary | ICD-10-CM | POA: Diagnosis not present

## 2022-05-14 ENCOUNTER — Other Ambulatory Visit (HOSPITAL_COMMUNITY): Payer: Self-pay

## 2022-05-14 NOTE — Telephone Encounter (Signed)
Discussed with Loraine Leriche in detail over the phone He may have gluten sensitivity but not alpha gal Lets go gluten-free strictly for 4 weeks to see if his symptoms improved. RG

## 2022-05-20 ENCOUNTER — Other Ambulatory Visit (HOSPITAL_COMMUNITY): Payer: Self-pay

## 2022-05-20 NOTE — Telephone Encounter (Signed)
Medication is not approved by the FDA and will not be covered by insurance

## 2022-05-25 ENCOUNTER — Encounter: Payer: Self-pay | Admitting: Gastroenterology

## 2022-05-26 ENCOUNTER — Encounter: Payer: Self-pay | Admitting: Gastroenterology

## 2022-05-29 ENCOUNTER — Encounter (HOSPITAL_COMMUNITY)
Admission: RE | Admit: 2022-05-29 | Discharge: 2022-05-29 | Disposition: A | Discharge status: Home / Self Care | Payer: BC Managed Care – PPO | Source: Ambulatory Visit | Attending: Gastroenterology | Admitting: Gastroenterology

## 2022-05-29 DIAGNOSIS — R1084 Generalized abdominal pain: Secondary | ICD-10-CM | POA: Insufficient documentation

## 2022-05-29 DIAGNOSIS — K760 Fatty (change of) liver, not elsewhere classified: Secondary | ICD-10-CM | POA: Insufficient documentation

## 2022-05-29 DIAGNOSIS — R1012 Left upper quadrant pain: Secondary | ICD-10-CM | POA: Diagnosis not present

## 2022-05-29 DIAGNOSIS — K580 Irritable bowel syndrome with diarrhea: Secondary | ICD-10-CM | POA: Diagnosis not present

## 2022-05-29 MED ORDER — TECHNETIUM TC 99M SULFUR COLLOID
2.2000 | Freq: Once | INTRAVENOUS | Status: AC
  Administered 2022-05-29: 2.2 via ORAL

## 2022-06-15 NOTE — Telephone Encounter (Signed)
Absolutely Lets refer to [facility]-first available please RG

## 2022-06-16 DIAGNOSIS — K580 Irritable bowel syndrome with diarrhea: Secondary | ICD-10-CM | POA: Diagnosis not present

## 2022-06-16 DIAGNOSIS — Z1330 Encounter for screening examination for mental health and behavioral disorders, unspecified: Secondary | ICD-10-CM | POA: Diagnosis not present

## 2022-06-16 DIAGNOSIS — F411 Generalized anxiety disorder: Secondary | ICD-10-CM | POA: Diagnosis not present

## 2022-06-16 DIAGNOSIS — Z1331 Encounter for screening for depression: Secondary | ICD-10-CM | POA: Diagnosis not present

## 2022-07-28 DIAGNOSIS — F411 Generalized anxiety disorder: Secondary | ICD-10-CM | POA: Diagnosis not present

## 2022-07-28 DIAGNOSIS — K580 Irritable bowel syndrome with diarrhea: Secondary | ICD-10-CM | POA: Diagnosis not present

## 2022-08-09 ENCOUNTER — Other Ambulatory Visit: Payer: Self-pay | Admitting: Gastroenterology

## 2022-09-08 ENCOUNTER — Ambulatory Visit: Payer: BC Managed Care – PPO | Admitting: Gastroenterology

## 2022-09-09 DIAGNOSIS — Z1330 Encounter for screening examination for mental health and behavioral disorders, unspecified: Secondary | ICD-10-CM | POA: Diagnosis not present

## 2022-09-09 DIAGNOSIS — F411 Generalized anxiety disorder: Secondary | ICD-10-CM | POA: Diagnosis not present

## 2022-09-23 DIAGNOSIS — D1801 Hemangioma of skin and subcutaneous tissue: Secondary | ICD-10-CM | POA: Diagnosis not present

## 2022-09-23 DIAGNOSIS — L298 Other pruritus: Secondary | ICD-10-CM | POA: Diagnosis not present

## 2022-10-08 DIAGNOSIS — H5213 Myopia, bilateral: Secondary | ICD-10-CM | POA: Diagnosis not present

## 2022-10-08 DIAGNOSIS — H35371 Puckering of macula, right eye: Secondary | ICD-10-CM | POA: Diagnosis not present

## 2022-10-08 DIAGNOSIS — H2513 Age-related nuclear cataract, bilateral: Secondary | ICD-10-CM | POA: Diagnosis not present

## 2022-10-15 ENCOUNTER — Telehealth: Payer: Self-pay

## 2022-10-15 NOTE — Telephone Encounter (Signed)
Lvm for patient to call back. Request from [facility] asking for bentyl 10mg  4 times a day and we have Levsin on file. Wondering what medication is he on and if he needs refills.

## 2022-10-16 NOTE — Telephone Encounter (Signed)
LVM again

## 2022-10-19 ENCOUNTER — Other Ambulatory Visit: Payer: Self-pay | Admitting: *Deleted

## 2022-10-19 MED ORDER — DICYCLOMINE HCL 10 MG PO CAPS: 10.0000 mg | ORAL_CAPSULE | Freq: Three times a day (TID) | ORAL | 0 refills | Status: DC

## 2022-10-19 NOTE — Progress Notes (Signed)
Patient was a previous [facility] patient but has recently been seeing Dr Chales Abrahams and wants refills of Dicyclomine per pharmacy request. Sent in new rx for patient under Dr Chales Abrahams

## 2022-10-19 NOTE — Telephone Encounter (Signed)
Someone already sent the order in

## 2022-10-27 ENCOUNTER — Encounter: Payer: Self-pay | Admitting: Family Medicine

## 2022-10-27 ENCOUNTER — Ambulatory Visit (INDEPENDENT_AMBULATORY_CARE_PROVIDER_SITE_OTHER): Payer: BC Managed Care – PPO | Admitting: Family Medicine

## 2022-10-27 VITALS — BP 104/70 | Ht 71.0 in | Wt 190.0 lb

## 2022-10-27 DIAGNOSIS — M2142 Flat foot [pes planus] (acquired), left foot: Secondary | ICD-10-CM | POA: Diagnosis not present

## 2022-10-27 DIAGNOSIS — M2141 Flat foot [pes planus] (acquired), right foot: Secondary | ICD-10-CM | POA: Insufficient documentation

## 2022-10-27 DIAGNOSIS — M722 Plantar fascial fibromatosis: Secondary | ICD-10-CM | POA: Diagnosis not present

## 2022-10-27 MED ORDER — CELECOXIB 200 MG PO CAPS
ORAL_CAPSULE | ORAL | 1 refills | Status: DC
Start: 2022-10-27 — End: 2023-06-22

## 2022-10-27 NOTE — Patient Instructions (Signed)
You have plantar fasciitis of your left heel - I sent a prescription for Celebrex 200mg  1 tab by mouth daily as needed for pain to the pharmacy.  You should take with food and use sparingly.  Do not take with other anti-inflammatories. - You can take tylenol and as needed for pain if the Celebrex is not helping - you should do the home exercises at least once daily, but up to twice a day if you are able - Ice heel for 15 minutes as needed - an ice bath is best, but you can also use a frozen water bottle for an ice massage - you should wear your shoe inserts whenever possible - Avoid flat shoes/barefoot walking as much as possible. - Arch straps have been shown to help with pain. We fitted you with these today - Steroid injection is a consideration for short term pain relief if you are struggling. - Physical therapy is also an option. - Follow up with me in 4-6 weeks.

## 2022-10-27 NOTE — Assessment & Plan Note (Signed)
Acute left plantar fasciitis related to his pes planus, as well as the recovery from his recent partial right knee replacement in January 2024  Plan: 1.  Diagnosis and treatment discussed in detail 2.  Prescription for Celebrex 200 mg 1 tab p.o. daily as needed with food given.  Should use sparingly.  Should not take with any other oral NSAIDs.  GI precautions reviewed. 3.  He is fitted with Hapad sports insoles in the office today with scaphoid pads and metatarsal pads bilaterally.  These were comfortable in the office and he had improved gait with these pads in his shoes. 4.  Also fitted with bilateral arch straps to wear throughout the day for added support 5.  He should limit walking barefoot whenever possible and be sure to wear shoes with good support 6.  It is okay for him to continue with elliptical as long as not having pain.  Should not return to any jogging until he is pain-free with daily activities. 7.  He will follow-up with me in 4 to 6 weeks for reevaluation, sooner as needed.  If symptoms are worsening could consider imaging, cortisone injections, or formal physical therapy, as well as custom orthotics 8.  Patient expressed understanding agreement with above.  All questions were answered

## 2022-10-27 NOTE — Assessment & Plan Note (Signed)
Bilateral pes planus which is likely contributing to his plantar fasciitis  Plan: He is fitted with Hapad sports insoles in the office today with scaphoid pads and metatarsal pads bilaterally.  These were comfortable in the office and he had improved gait with these pads in his shoes. Also fitted with bilateral arch straps to wear throughout the day for added support He should limit walking barefoot whenever possible and be sure to wear shoes with good support Could consider custom orthotics in the future if necessary

## 2022-10-27 NOTE — Progress Notes (Signed)
DATE OF VISIT: 10/27/2022        Kevin Reyes DOB: [birth year] MRN: [id number]  CC:  Plantar fasciitis left heel  History- Kevin Reyes is a 67 y.o.  male for evaluation and treatment of left heel pain.  Previously seen by [place] in 2014 for Rt plantar fasciitis and metatarsalgia.  Was given Sports Insoles with MT pads at that time, as well as arch straps and HEP.  Today he reports pain in the left heel Feels similar to when had PF in the past Rt partial knee replacement in January 2024  - Started jogging on treadmill recently - pain started a few weeks after starting running Has flat feet Has orthotics - not sure if using orthotics in his running shoes - off the shelf orthotics Feels like not wearing best footwear  Pain in the AM when getting out of bed Worse at end of the day Not using heat or ice Has used [place] sock in the past which was helpful  Ok with elliptical   Past Medical History Past Medical History:  Diagnosis Date   Anxiety    Arthritis    Bradycardia    asymptomatic   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     Past Surgical History Past Surgical History:  Procedure Laterality Date   COLONOSCOPY     DRUG INDUCED ENDOSCOPY     KNEE ARTHROSCOPY Right    PARTIAL KNEE ARTHROPLASTY Right 01/20/2022   Procedure: UNICOMPARTMENTAL KNEE MEDIALLY;  Surgeon: Durene Romans, MD;  Location: [facility];  Service: Orthopedics;  Laterality: Right;   US ECHOCARDIOGRAPHY  05/08/2008   EF 55-60%   US ECHOCARDIOGRAPHY  08/23/2002   EF 60-65%    Medications Current Outpatient Medications  Medication Sig Dispense Refill   celecoxib (CELEBREX) 200 MG capsule 1 tab po daily with food prn pain 30 capsule 1    atorvastatin (LIPITOR) 20 MG tablet TAKE 1 TABLET BY MOUTH DAILY AT 6 PM. 90 tablet 3   busPIRone (BUSPAR) 10 MG tablet Take 10 mg by mouth 3 (three) times daily.     clonazePAM (KLONOPIN) 0.5 MG tablet Take 0.5 mg by mouth as needed for anxiety.     dicyclomine (BENTYL) 10 MG capsule Take 1 capsule (10 mg total) by mouth 4 (four) times daily -  before meals and at bedtime. 90 capsule 0   escitalopram (LEXAPRO) 5 MG tablet Take 5 mg by mouth at bedtime.     famotidine (PEPCID) 40 MG tablet Take 1 tablet (40 mg total) by mouth at bedtime. 90 tablet 3   finasteride (PROSCAR) 5 MG tablet Take 5 mg by mouth daily.  3   gabapentin (NEURONTIN) 300 MG capsule Take 600 mg by mouth at bedtime.     hyoscyamine (LEVSIN SL) 0.125 MG SL tablet PLACE 1 TABLET (0.125 MG TOTAL) UNDER THE TONGUE EVERY 6 (SIX) HOURS AS NEEDED. 270 tablet 0   pantoprazole (PROTONIX) 40 MG tablet TAKE 1 TABLET BY MOUTH TWICE A DAY 180 tablet 1   propranolol (INDERAL) 20 MG tablet Take 20 mg by mouth daily.     sildenafil (VIAGRA) 100 MG tablet  Take 100 mg by mouth as needed for erectile dysfunction.     Varenicline Tartrate (TYRVAYA NA) Place 1 drop into both eyes in the morning and at bedtime.     No current facility-administered medications for this visit.    Allergies has No Known Allergies.  Family History - reviewed per EMR and intake form  Social History   reports current alcohol use of about 4.0 standard drinks of alcohol per week.  reports that he has never smoked. He has never used smokeless tobacco.  reports no history of drug use. 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  He has early hammering of the second and third toes on  the right, mild hammering of the second toe on the left.  Some associated transverse arch collapse.  He is tender palpation along the insertion of the plantar fascia along the left medial calcaneus.  No tenderness along the plantar fascia on the right.  He has no other tenderness along the midfoot or the forefoot. Good Achilles flexibility bilaterally. Gait: Overpronation bilaterally left greater than right, but this was improved with sports insoles placed in his shoes today  NEURO: sensation intact to light touch lower extremity bilaterally VASC: pulses 2+ and symmetric DP/PT bilaterally, no edema   Assessment & Plan Plantar fasciitis of left foot Acute left plantar fasciitis related to his pes planus, as well as the recovery from his recent partial right knee replacement in January 2024  Plan: 1.  Diagnosis and treatment discussed in detail 2.  Prescription for Celebrex 200 mg 1 tab p.o. daily as needed with food given.  Should use sparingly.  Should not take with any other oral NSAIDs.  GI precautions reviewed. 3.  He is fitted with Hapad sports insoles in the office today with scaphoid pads and metatarsal pads bilaterally.  These were comfortable in the office and he had improved gait with these pads in his shoes. 4.  Also fitted with bilateral arch straps to wear throughout the day for added support 5.  He should limit walking barefoot whenever possible and be sure to wear shoes with good support 6.  It is okay for him to continue with elliptical as long as not having pain.  Should not return to any jogging until he is pain-free with daily activities. 7.  He will follow-up with me in 4 to 6 weeks for reevaluation, sooner as needed.  If symptoms are worsening could consider imaging, cortisone injections, or formal physical therapy, as well as custom orthotics 8.  Patient expressed understanding agreement with above.  All questions were answered Pes planus of both feet Bilateral pes planus  which is likely contributing to his plantar fasciitis  Plan: He is fitted with Hapad sports insoles in the office today with scaphoid pads and metatarsal pads bilaterally.  These were comfortable in the office and he had improved gait with these pads in his shoes. Also fitted with bilateral arch straps to wear throughout the day for added support He should limit walking barefoot whenever possible and be sure to wear shoes with good support Could consider custom orthotics in the future if necessary    Encounter Diagnoses  Name Primary?  Plantar fasciitis of left foot Yes   Pes planus of both feet     No orders of the defined types were placed in this encounter.   No orders of the defined types were placed in this encounter.

## 2022-10-28 DIAGNOSIS — E559 Vitamin D deficiency, unspecified: Secondary | ICD-10-CM | POA: Diagnosis not present

## 2022-10-28 DIAGNOSIS — R001 Bradycardia, unspecified: Secondary | ICD-10-CM | POA: Diagnosis not present

## 2022-10-28 DIAGNOSIS — Z23 Encounter for immunization: Secondary | ICD-10-CM | POA: Diagnosis not present

## 2022-10-28 DIAGNOSIS — N400 Enlarged prostate without lower urinary tract symptoms: Secondary | ICD-10-CM | POA: Diagnosis not present

## 2022-10-28 DIAGNOSIS — F411 Generalized anxiety disorder: Secondary | ICD-10-CM | POA: Diagnosis not present

## 2022-10-28 DIAGNOSIS — K219 Gastro-esophageal reflux disease without esophagitis: Secondary | ICD-10-CM | POA: Diagnosis not present

## 2022-10-28 DIAGNOSIS — E785 Hyperlipidemia, unspecified: Secondary | ICD-10-CM | POA: Diagnosis not present

## 2022-10-29 DIAGNOSIS — R351 Nocturia: Secondary | ICD-10-CM | POA: Diagnosis not present

## 2022-10-29 DIAGNOSIS — N5201 Erectile dysfunction due to arterial insufficiency: Secondary | ICD-10-CM | POA: Diagnosis not present

## 2022-10-29 DIAGNOSIS — N401 Enlarged prostate with lower urinary tract symptoms: Secondary | ICD-10-CM | POA: Diagnosis not present

## 2022-11-13 DIAGNOSIS — N400 Enlarged prostate without lower urinary tract symptoms: Secondary | ICD-10-CM | POA: Diagnosis not present

## 2022-11-19 DIAGNOSIS — D485 Neoplasm of uncertain behavior of skin: Secondary | ICD-10-CM | POA: Diagnosis not present

## 2022-11-19 DIAGNOSIS — D225 Melanocytic nevi of trunk: Secondary | ICD-10-CM | POA: Diagnosis not present

## 2022-11-19 DIAGNOSIS — L570 Actinic keratosis: Secondary | ICD-10-CM | POA: Diagnosis not present

## 2022-11-19 DIAGNOSIS — L814 Other melanin hyperpigmentation: Secondary | ICD-10-CM | POA: Diagnosis not present

## 2022-11-19 DIAGNOSIS — L821 Other seborrheic keratosis: Secondary | ICD-10-CM | POA: Diagnosis not present

## 2022-11-19 DIAGNOSIS — Z85828 Personal history of other malignant neoplasm of skin: Secondary | ICD-10-CM | POA: Diagnosis not present

## 2023-01-07 DIAGNOSIS — G43909 Migraine, unspecified, not intractable, without status migrainosus: Secondary | ICD-10-CM | POA: Diagnosis not present

## 2023-01-07 DIAGNOSIS — H33302 Unspecified retinal break, left eye: Secondary | ICD-10-CM | POA: Diagnosis not present

## 2023-01-27 DIAGNOSIS — F411 Generalized anxiety disorder: Secondary | ICD-10-CM | POA: Diagnosis not present

## 2023-03-09 DIAGNOSIS — F411 Generalized anxiety disorder: Secondary | ICD-10-CM | POA: Diagnosis not present

## 2023-03-17 DIAGNOSIS — F411 Generalized anxiety disorder: Secondary | ICD-10-CM | POA: Diagnosis not present

## 2023-03-31 DIAGNOSIS — F411 Generalized anxiety disorder: Secondary | ICD-10-CM | POA: Diagnosis not present

## 2023-04-08 DIAGNOSIS — S0181XA Laceration without foreign body of other part of head, initial encounter: Secondary | ICD-10-CM | POA: Diagnosis not present

## 2023-04-08 DIAGNOSIS — Z6826 Body mass index (BMI) 26.0-26.9, adult: Secondary | ICD-10-CM | POA: Diagnosis not present

## 2023-04-14 DIAGNOSIS — Z79899 Other long term (current) drug therapy: Secondary | ICD-10-CM | POA: Diagnosis not present

## 2023-04-14 DIAGNOSIS — Z0000 Encounter for general adult medical examination without abnormal findings: Secondary | ICD-10-CM | POA: Diagnosis not present

## 2023-04-14 DIAGNOSIS — E785 Hyperlipidemia, unspecified: Secondary | ICD-10-CM | POA: Diagnosis not present

## 2023-04-14 DIAGNOSIS — K219 Gastro-esophageal reflux disease without esophagitis: Secondary | ICD-10-CM | POA: Diagnosis not present

## 2023-04-14 DIAGNOSIS — N400 Enlarged prostate without lower urinary tract symptoms: Secondary | ICD-10-CM | POA: Diagnosis not present

## 2023-04-14 DIAGNOSIS — Z23 Encounter for immunization: Secondary | ICD-10-CM | POA: Diagnosis not present

## 2023-04-14 DIAGNOSIS — N529 Male erectile dysfunction, unspecified: Secondary | ICD-10-CM | POA: Diagnosis not present

## 2023-04-16 DIAGNOSIS — Z23 Encounter for immunization: Secondary | ICD-10-CM | POA: Diagnosis not present

## 2023-05-05 ENCOUNTER — Encounter: Payer: Self-pay | Admitting: Cardiovascular Disease

## 2023-05-05 NOTE — Progress Notes (Unsigned)
 Cardiology Office Note   Date:  05/06/2023   ID:  Kevin Reyes, Kevin Reyes Sep 12, 1955, MRN [id number]  PCP:  Perley Bradley, MD  Cardiologist:   Ahmad Alert, MD   Chief Complaint  Patient presents with   Mitral Valve Prolapse   Hyperlipidemia   Problem List  1. hyperlipidemia 2. Mitral Valve prolapse with mild mitral regurgitation.  Previous Notes:   Kevin Reyes is a healthy 78 her gentleman with known for many years. We have seen him in the past for hyperlipidemia and mitral valve prolapse. He was last seen by Avanell Bob in 2012. He's done well since I last saw him. He continues to run on a regular basis. He's not had any episodes of chest pain or shortness of breath.  He has not had any chest pain or dyspnea.     May 02, 2013:  Kevin Reyes is doing well.  He stopped his ASA due to some stomach issues.   He still exercising on regular basis.  May 09, 2014 Kevin Reyes is a 68 y.o. male who presents for MVP and HTN Having some headaches. Still running.  Lifting weights regularly.  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 Still jogging some 3-4 times a week .   Does some weights on the off days .   No CP , no dyspnea   March 08, 2018: Seen today for follow-up of his mitral prolapse and hyperlipidemia Has run out of his Atorvastatin .   Staying active.   No longer running , doing elliptical  Busy at work .    No CP , no dyspnea.     April 28, 2019: Kevin Reyes is seen today for follow-up of his hyperlipidemia and mitral valve prolapse. Has had both vaccines  Exercising regularly  Feels well  Does not run any longer - knee issues .   Does the elliptical  No CP or dyspnea   February 13, 2021: Kevin Reyes is seen today.  He is overall doing well.  He is not jogging any longer because of knee issues.  His EKG is unchanged.  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     Past Surgical History:  Procedure Laterality Date   COLONOSCOPY     DRUG INDUCED ENDOSCOPY     KNEE ARTHROSCOPY Right    PARTIAL KNEE ARTHROPLASTY Right 01/20/2022   Procedure: UNICOMPARTMENTAL KNEE MEDIALLY;  Surgeon: Claiborne Crew, MD;  Location: [facility];  Service: Orthopedics;  Laterality: Right;   US  ECHOCARDIOGRAPHY  05/08/2008   EF 55-60%   US  ECHOCARDIOGRAPHY  08/23/2002   EF 60-65%     Current Outpatient Medications  Medication Sig Dispense Refill   busPIRone  (BUSPAR ) 10 MG tablet Take 10 mg by  mouth 3 (three) times daily.     dicyclomine  (BENTYL ) 10 MG capsule Take 1 capsule (10 mg total) by mouth 4 (four) times daily -  before meals and at bedtime. 90 capsule 0   famotidine  (PEPCID ) 40 MG tablet Take 1 tablet (40 mg total) by mouth at bedtime. 90 tablet 3   finasteride  (PROSCAR ) 5 MG tablet Take 5 mg by mouth daily.  3   gabapentin  (NEURONTIN ) 300 MG capsule Take 600 mg by mouth at bedtime.     pantoprazole  (PROTONIX ) 40 MG tablet TAKE 1 TABLET BY MOUTH TWICE A DAY 180 tablet 1   sildenafil (VIAGRA) 100 MG tablet Take 100 mg by mouth as needed for erectile dysfunction.     triamcinolone  cream (KENALOG) 0.1 % SMARTSIG:1 sparingly Topical Twice Daily     Varenicline Tartrate (TYRVAYA NA) Place into the nose 2 (two) times daily.     atorvastatin  (LIPITOR) 20 MG tablet TAKE 1 TABLET BY MOUTH DAILY AT 6 PM. 90 tablet 3   celecoxib  (CELEBREX ) 200 MG capsule 1 tab po daily with food prn pain 30 capsule 1   clonazePAM (KLONOPIN) 0.5 MG tablet Take  0.5 mg by mouth as needed for anxiety.     escitalopram (LEXAPRO) 5 MG tablet Take 5 mg by mouth at bedtime.     hyoscyamine  (LEVSIN SL) 0.125 MG SL tablet PLACE 1 TABLET (0.125 MG TOTAL) UNDER THE TONGUE EVERY 6 (SIX) HOURS AS NEEDED. 270 tablet 0   Varenicline Tartrate (TYRVAYA NA) Place 1 drop into both eyes in the morning and at bedtime.     No current facility-administered medications for this visit.    Allergies:   Patient has no known allergies.    Social History:  The patient  reports that he has never smoked. He has never used smokeless tobacco. He reports current alcohol use of about 4.0 standard drinks of alcohol per week. He reports that he does not use drugs.   Family History:  The patient's family history includes Colon cancer in his maternal aunt; Hypertension in his mother.    ROS:  Please see the history of present illness.    Physical Exam: Blood pressure 116/74, pulse (!) 37, height 5\' 11"  (1.803 m), weight 188 lb (85.3 kg), SpO2 97%.       GEN:  Well nourished, well developed in no acute distress HEENT: Normal NECK: No JVD; No carotid bruits LYMPHATICS: No lymphadenopathy CARDIAC: RRR , very soft systolic murmur  RESPIRATORY:  Clear to auscultation without rales, wheezing or rhonchi  ABDOMEN: Soft, non-tender, non-distended MUSCULOSKELETAL:  No edema; No deformity  SKIN: Warm and dry NEUROLOGIC:  Alert and oriented x 3   EKG:    EKG Interpretation Date/Time:  Thursday May 06 2023 16:43:32 EDT Ventricular Rate:  36 PR Interval:  150 QRS Duration:  104 QT Interval:  496 QTC Calculation: 383 R Axis:   16  Text Interpretation: Marked sinus bradycardia Nonspecific T wave abnormality No previous ECGs available Confirmed by Ahmad Alert ([id number]) on 05/06/2023 5:18:14 PM     Recent Labs: No results found for requested labs within last 365 days.    Lipid Panel    Component Value Date/Time   CHOL 155 02/24/2022 0911   TRIG 246 (H) 02/24/2022 0911    HDL 45 02/13/2021 1630   CHOLHDL 3.4 02/13/2021 1630   CHOLHDL 3 05/09/2014 0758   VLDL 18.8 05/09/2014 0758   LDLCALC 76 02/13/2021 1630      Wt Readings  from Last 3 Encounters:  05/06/23 188 lb (85.3 kg)  10/27/22 190 lb (86.2 kg)  05/05/22 189 lb (85.7 kg)      Other studies Reviewed: Additional studies/ records that were reviewed today include: . Review of the above records demonstrates:    ASSESSMENT AND PLAN:  1. Hyperlipidemia -he had labs recently at his primary medical doctor.  Continue atorvastatin  20 mg a day.  All of his labs faxed over in the next week or so.   2. Mitral Valve prolapse with mild mitral regurgitation.  He has very mild mitral valve prolapse with mild mitral digitation.  His exam is been very stable for years.  Will get an updated echocardiogram since his last echo was 15 years ago.     Current medicines are reviewed at length with the patient today.  The patient does not have concerns regarding medicines.  The following changes have been made:  no change  Labs/ tests ordered today include:   Orders Placed This Encounter  Procedures   EKG 12-Lead   ECHOCARDIOGRAM COMPLETE      Disposition:       Signed, Ahmad Alert, MD  05/06/2023 5:18 PM    [facility] [address] [place], [place], [place]  [address] Phone: [phone]; Fax: [phone]

## 2023-05-06 ENCOUNTER — Ambulatory Visit: Payer: BC Managed Care – PPO | Attending: Cardiovascular Disease | Admitting: Cardiovascular Disease

## 2023-05-06 ENCOUNTER — Encounter: Payer: Self-pay | Admitting: Cardiovascular Disease

## 2023-05-06 VITALS — BP 116/74 | HR 37 | Ht 71.0 in | Wt 188.0 lb

## 2023-05-06 DIAGNOSIS — I341 Nonrheumatic mitral (valve) prolapse: Secondary | ICD-10-CM

## 2023-05-06 DIAGNOSIS — E782 Mixed hyperlipidemia: Secondary | ICD-10-CM | POA: Diagnosis not present

## 2023-05-06 DIAGNOSIS — I340 Nonrheumatic mitral (valve) insufficiency: Secondary | ICD-10-CM | POA: Diagnosis not present

## 2023-05-06 MED ORDER — ATORVASTATIN CALCIUM 20 MG PO TABS: ORAL_TABLET | ORAL | 3 refills | Status: AC

## 2023-05-06 NOTE — Patient Instructions (Signed)
 Medication Instructions:  STOP PROPRANOLOL    *If you need a refill on your cardiac medications before your next appointment, please call your pharmacy*  Lab Work:  If you have labs (blood work) drawn today and your tests are completely normal, you will receive your results only by: MyChart Message (if you have MyChart) OR A paper copy in the mail If you have any lab test that is abnormal or we need to change your treatment, we will call you to review the results.  Testing/Procedures: Your physician has requested that you have an echocardiogram. Echocardiography is a painless test that uses sound waves to create images of your heart. It provides your doctor with information about the size and shape of your heart and how well your heart's chambers and valves are working. This procedure takes approximately one hour. There are no restrictions for this procedure. Please do NOT wear cologne, perfume, aftershave, or lotions (deodorant is allowed). Please arrive 15 minutes prior to your appointment time.  Please note: We ask at that you not bring children with you during ultrasound (echo/ vascular) testing. Due to room size and safety concerns, children are not allowed in the ultrasound rooms during exams. Our front office staff cannot provide observation of children in our lobby area while testing is being conducted. An adult accompanying a patient to their appointment will only be allowed in the ultrasound room at the discretion of the ultrasound technician under special circumstances. We apologize for any inconvenience.   Follow-Up: At [facility], you and your health needs are our priority.  As part of our continuing mission to provide you with exceptional heart care, our providers are all part of one team.  This team includes your primary Cardiologist (physician) and Advanced Practice Providers or APPs (Physician Assistants and Nurse Practitioners) who all work together to provide you  with the care you need, when you need it.  Your next appointment:   1 year(s)  Provider:   DR Renna Cary     We recommend signing up for the patient portal called "MyChart".  Sign up information is provided on this After Visit Summary.  MyChart is used to connect with patients for Virtual Visits (Telemedicine).  Patients are able to view lab/test results, encounter notes, upcoming appointments, etc.  Non-urgent messages can be sent to your provider as well.   To learn more about what you can do with MyChart, go to [email or web address].   Other Instructions       1st Floor: - Lobby - Registration  - Pharmacy  - Lab - Cafe  2nd Floor: - PV Lab - Diagnostic Testing (echo, CT, nuclear med)  3rd Floor: - Vacant  4th Floor: - TCTS (cardiothoracic surgery) - AFib Clinic - Structural Heart Clinic - Vascular Surgery  - Vascular Ultrasound  5th Floor: - HeartCare Cardiology (general and EP) - Clinical Pharmacy for coumadin, hypertension, lipid, weight-loss medications, and med management appointments    Valet parking services will be available as well.

## 2023-05-15 ENCOUNTER — Other Ambulatory Visit: Payer: Self-pay | Admitting: Gastroenterology

## 2023-05-19 ENCOUNTER — Encounter (HOSPITAL_COMMUNITY): Payer: Self-pay | Admitting: Cardiovascular Disease

## 2023-06-05 ENCOUNTER — Other Ambulatory Visit: Payer: Self-pay | Admitting: Gastroenterology

## 2023-06-05 DIAGNOSIS — K259 Gastric ulcer, unspecified as acute or chronic, without hemorrhage or perforation: Secondary | ICD-10-CM

## 2023-06-05 DIAGNOSIS — K2980 Duodenitis without bleeding: Secondary | ICD-10-CM

## 2023-06-11 ENCOUNTER — Telehealth: Payer: Self-pay | Admitting: Gastroenterology

## 2023-06-11 DIAGNOSIS — K259 Gastric ulcer, unspecified as acute or chronic, without hemorrhage or perforation: Secondary | ICD-10-CM

## 2023-06-11 DIAGNOSIS — K2980 Duodenitis without bleeding: Secondary | ICD-10-CM

## 2023-06-11 MED ORDER — PANTOPRAZOLE SODIUM 40 MG PO TBEC: 40.0000 mg | DELAYED_RELEASE_TABLET | Freq: Two times a day (BID) | ORAL | 0 refills | Status: DC

## 2023-06-11 NOTE — Telephone Encounter (Signed)
 Inbound call from patient requesting for refill for pantoprazole  be sent to [facility] at [phone] in [place]. Patient has been scheduled for 6/24. Please advise, thank you

## 2023-06-11 NOTE — Telephone Encounter (Signed)
 Done

## 2023-06-14 ENCOUNTER — Other Ambulatory Visit: Payer: Self-pay | Admitting: Medical

## 2023-06-14 DIAGNOSIS — M25572 Pain in left ankle and joints of left foot: Secondary | ICD-10-CM | POA: Diagnosis not present

## 2023-06-15 ENCOUNTER — Ambulatory Visit
Admission: RE | Admit: 2023-06-15 | Discharge: 2023-06-15 | Disposition: A | Discharge status: Home / Self Care | Source: Ambulatory Visit | Attending: Medical

## 2023-06-15 DIAGNOSIS — R936 Abnormal findings on diagnostic imaging of limbs: Secondary | ICD-10-CM | POA: Diagnosis not present

## 2023-06-15 DIAGNOSIS — S99912A Unspecified injury of left ankle, initial encounter: Secondary | ICD-10-CM | POA: Diagnosis not present

## 2023-06-15 DIAGNOSIS — M25572 Pain in left ankle and joints of left foot: Secondary | ICD-10-CM

## 2023-06-17 DIAGNOSIS — S93432A Sprain of tibiofibular ligament of left ankle, initial encounter: Secondary | ICD-10-CM | POA: Diagnosis not present

## 2023-06-17 DIAGNOSIS — S93402A Sprain of unspecified ligament of left ankle, initial encounter: Secondary | ICD-10-CM | POA: Diagnosis not present

## 2023-06-17 DIAGNOSIS — S93422A Sprain of deltoid ligament of left ankle, initial encounter: Secondary | ICD-10-CM | POA: Diagnosis not present

## 2023-06-17 DIAGNOSIS — S96912A Strain of unspecified muscle and tendon at ankle and foot level, left foot, initial encounter: Secondary | ICD-10-CM | POA: Diagnosis not present

## 2023-06-18 ENCOUNTER — Ambulatory Visit
Admission: RE | Admit: 2023-06-18 | Discharge: 2023-06-18 | Disposition: A | Discharge status: Home / Self Care | Source: Ambulatory Visit | Attending: Orthopaedic Surgery | Admitting: Orthopaedic Surgery

## 2023-06-18 ENCOUNTER — Other Ambulatory Visit: Payer: Self-pay | Admitting: Orthopaedic Surgery

## 2023-06-18 DIAGNOSIS — M76822 Posterior tibial tendinitis, left leg: Secondary | ICD-10-CM | POA: Diagnosis not present

## 2023-06-18 DIAGNOSIS — S93432A Sprain of tibiofibular ligament of left ankle, initial encounter: Secondary | ICD-10-CM

## 2023-06-18 DIAGNOSIS — R600 Localized edema: Secondary | ICD-10-CM | POA: Diagnosis not present

## 2023-06-18 DIAGNOSIS — M24272 Disorder of ligament, left ankle: Secondary | ICD-10-CM | POA: Diagnosis not present

## 2023-06-22 ENCOUNTER — Other Ambulatory Visit: Payer: Self-pay

## 2023-06-22 ENCOUNTER — Encounter (HOSPITAL_BASED_OUTPATIENT_CLINIC_OR_DEPARTMENT_OTHER): Payer: Self-pay | Admitting: Orthopaedic Surgery

## 2023-06-27 NOTE — H&P (Signed)
 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     Past Surgical History:  Procedure Laterality Date   COLONOSCOPY     DRUG INDUCED ENDOSCOPY     KNEE ARTHROSCOPY Right    PARTIAL KNEE ARTHROPLASTY Right 01/20/2022   Procedure: UNICOMPARTMENTAL KNEE MEDIALLY;  Surgeon: Claiborne Crew, MD;  Location: [facility];  Service: Orthopedics;  Laterality: Right;   US  ECHOCARDIOGRAPHY  05/08/2008   EF 55-60%   US  ECHOCARDIOGRAPHY  08/23/2002   EF 60-65%     (Not in an outpatient encounter)    No Known Allergies  Social History   Socioeconomic History   Marital status: Married    Spouse name: Not on file   Number of children: Not on file   Years of education: Not on file   Highest education level: Not on file  Occupational History   Not on file  Tobacco Use   Smoking status: Never   Smokeless tobacco: Never  Vaping Use   Vaping status: Never Used  Substance and Sexual Activity   Alcohol use: Yes    Alcohol/week: 4.0 standard drinks of alcohol    Types: 4 Shots of liquor per week    Comment: social use   Drug use: No   Sexual activity: Not on file  Other Topics Concern   Not on file  Social History Narrative   Not on file   Social Drivers of Health   Financial Resource Strain: Not on file  Food Insecurity: No Food Insecurity (01/20/2022)   Hunger Vital Sign    Worried About Running Out of Food in the Last Year: Never true    Ran Out of Food in the Last Year: Never true  Transportation Needs: No Transportation Needs  (01/20/2022)   PRAPARE - Administrator, Civil Service (Medical): No    Lack of Transportation (Non-Medical): No  Physical Activity: Not on file  Stress: Not on file  Social Connections: Not on file  Intimate Partner Violence: Not At Risk (01/20/2022)   Humiliation, Afraid, Rape, and Kick questionnaire    Fear of Current or Ex-Partner: No    Emotionally Abused: No    Physically Abused: No    Sexually Abused: No     History reviewed. No pertinent family history.   Review of Systems Pertinent items are noted in HPI.  Objective: Vital signs in last 24 hours:    06/22/2023    4:24 PM 05/06/2023    4:41 PM 10/27/2022    4:05 PM  Vitals with BMI  Height 5' 11 5' 11 5' 11  Weight 185 lbs 188 lbs 190 lbs  BMI 25.81 26.23 26.51  Systolic  116 104  Diastolic  74 70  Pulse  37       EXAM: General: Well nourished, well developed. Awake, alert and oriented to time, place, person. Normal mood and affect. No apparent distress. Breathing room  air.  Operative Lower Extremity: Alignment - Neutral Deformity - None Skin intact Tenderness to palpation - left ankle 5/5 TA, PT, GS, Per, EHL, FHL Sensation intact to light touch throughout Palpable DP and PT pulses Special testing: None  The contralateral foot/ankle was examined for comparison and noted to be neurovascularly intact with no localized deformity, swelling, or tenderness.  Imaging Review All images taken were independently reviewed by me.  Assessment/Plan: The clinical and radiographic findings were reviewed and discussed at length with the patient.  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  We spoke at length about the natural course of these findings. We discussed nonoperative and operative treatment options in detail.  The risks and benefits were presented and reviewed. The risks due to hardware failure/irritation, new/persistent/recurrent infection, stiffness, nerve/vessel/tendon  injury, nonunion/malunion of any fracture, wound healing issues, allograft usage, development of arthritis, failure of this surgery, possibility of external fixation in certain situations, possibility of delayed definitive surgery, need for further surgery, prolonged wound care including further soft tissue coverage procedures, thromboembolic events, anesthesia/medical complications/events perioperatively and beyond, amputation, death among others were discussed. The patient acknowledged the explanation and agreed to proceed with the plan.  Kevin Reyes  Orthopaedic Surgery [facility]

## 2023-06-27 NOTE — Discharge Instructions (Signed)
 Netta Cedars, MD [facility]  Please read the following information regarding your care after surgery.  Medications  You only need a prescription for the narcotic pain medicine (ex. oxycodone, Percocet, Norco).  All of the other medicines listed below are available over the counter. ? Aleve 2 pills twice a day for the first 3 days after surgery. ? acetominophen (Tylenol) 650 mg every 4-6 hours as you need for minor to moderate pain ? oxycodone as prescribed for severe pain  ? To help prevent blood clots, take aspirin (81 mg) twice daily for at least 42 days after surgery.  You should also get up every hour while you are awake to move around.  Weight Bearing ? Do NOT bear any weight on the operated leg or foot. This means do NOT touch your surgical leg to the ground!  Cast / Splint / Dressing ? If you have a splint, do NOT remove this. Keep your splint, cast or dressing clean and dry.  Don't put anything (coat hanger, pencil, etc) down inside of it.  If it gets wet, call the office immediately to schedule an appointment for a cast change.  Swelling IMPORTANT: It is normal for you to have swelling where you had surgery. To reduce swelling and pain, keep at least 3 pillows under your leg so that your toes are above your nose and your heel is above the level of your hip.  It may be necessary to keep your foot or leg elevated for several weeks.  This is critical to helping your incisions heal and your pain to feel better.  Follow Up Call my office at [phone] when you are discharged from the hospital or surgery center to schedule an appointment to be seen 7-10 days after surgery.  Call my office at [phone] if you develop a fever >101.5 F, nausea, vomiting, bleeding from the surgical site or severe pain.     Post Anesthesia Home Care Instructions  Activity: Get plenty of rest for the remainder of the day. A responsible individual must stay with you for 24 hours following the  procedure.  For the next 24 hours, DO NOT: -Drive a car -Advertising copywriter -Drink alcoholic beverages -Take any medication unless instructed by your physician -Make any legal decisions or sign important papers.  Meals: Start with liquid foods such as gelatin or soup. Progress to regular foods as tolerated. Avoid greasy, spicy, heavy foods. If nausea and/or vomiting occur, drink only clear liquids until the nausea and/or vomiting subsides. Call your physician if vomiting continues.  Special Instructions/Symptoms: Your throat may feel dry or sore from the anesthesia or the breathing tube placed in your throat during surgery. If this causes discomfort, gargle with warm salt water. The discomfort should disappear within 24 hours.  If you had a scopolamine patch placed behind your ear for the management of post- operative nausea and/or vomiting:  1. The medication in the patch is effective for 72 hours, after which it should be removed.  Wrap patch in a tissue and discard in the trash. Wash hands thoroughly with soap and water. 2. You may remove the patch earlier than 72 hours if you experience unpleasant side effects which may include dry mouth, dizziness or visual disturbances. 3. Avoid touching the patch. Wash your hands with soap and water after contact with the patch.  Regional Anesthesia Blocks  1. You may not be able to move or feel the "blocked" extremity after a regional anesthetic block. This may last may last  from 3-48 hours after placement, but it will go away. The length of time depends on the medication injected and your individual response to the medication. As the nerves start to wake up, you may experience tingling as the movement and feeling returns to your extremity. If the numbness and inability to move your extremity has not gone away after 48 hours, please call your surgeon.   2. The extremity that is blocked will need to be protected until the numbness is gone and the  strength has returned. Because you cannot feel it, you will need to take extra care to avoid injury. Because it may be weak, you may have difficulty moving it or using it. You may not know what position it is in without looking at it while the block is in effect.  3. For blocks in the legs and feet, returning to weight bearing and walking needs to be done carefully. You will need to wait until the numbness is entirely gone and the strength has returned. You should be able to move your leg and foot normally before you try and bear weight or walk. You will need someone to be with you when you first try to ensure you do not fall and possibly risk injury.  4. Bruising and tenderness at the needle site are common side effects and will resolve in a few days.  5. Persistent numbness or new problems with movement should be communicated to the surgeon or the [facility] [phone] [facility] [phone]).

## 2023-06-30 ENCOUNTER — Encounter (HOSPITAL_BASED_OUTPATIENT_CLINIC_OR_DEPARTMENT_OTHER): Payer: Self-pay | Admitting: Orthopaedic Surgery

## 2023-06-30 ENCOUNTER — Ambulatory Visit (HOSPITAL_COMMUNITY)

## 2023-06-30 ENCOUNTER — Ambulatory Visit (HOSPITAL_BASED_OUTPATIENT_CLINIC_OR_DEPARTMENT_OTHER): Admitting: Anesthesiology

## 2023-06-30 ENCOUNTER — Encounter (HOSPITAL_BASED_OUTPATIENT_CLINIC_OR_DEPARTMENT_OTHER)
Admission: RE | Disposition: A | Discharge status: Home / Self Care | Payer: Self-pay | Source: Home / Self Care | Attending: Orthopaedic Surgery

## 2023-06-30 ENCOUNTER — Other Ambulatory Visit: Payer: Self-pay

## 2023-06-30 ENCOUNTER — Ambulatory Visit (HOSPITAL_BASED_OUTPATIENT_CLINIC_OR_DEPARTMENT_OTHER)
Admission: RE | Admit: 2023-06-30 | Discharge: 2023-06-30 | Disposition: A | Discharge status: Home / Self Care | Attending: Orthopaedic Surgery | Admitting: Orthopaedic Surgery

## 2023-06-30 DIAGNOSIS — M94272 Chondromalacia, left ankle and joints of left foot: Secondary | ICD-10-CM | POA: Diagnosis not present

## 2023-06-30 DIAGNOSIS — X58XXXA Exposure to other specified factors, initial encounter: Secondary | ICD-10-CM | POA: Insufficient documentation

## 2023-06-30 DIAGNOSIS — Z01818 Encounter for other preprocedural examination: Secondary | ICD-10-CM

## 2023-06-30 DIAGNOSIS — M65872 Other synovitis and tenosynovitis, left ankle and foot: Secondary | ICD-10-CM | POA: Diagnosis not present

## 2023-06-30 DIAGNOSIS — G8918 Other acute postprocedural pain: Secondary | ICD-10-CM | POA: Diagnosis not present

## 2023-06-30 DIAGNOSIS — S93432A Sprain of tibiofibular ligament of left ankle, initial encounter: Secondary | ICD-10-CM | POA: Insufficient documentation

## 2023-06-30 DIAGNOSIS — S93422A Sprain of deltoid ligament of left ankle, initial encounter: Secondary | ICD-10-CM | POA: Diagnosis not present

## 2023-06-30 DIAGNOSIS — S82832A Other fracture of upper and lower end of left fibula, initial encounter for closed fracture: Secondary | ICD-10-CM | POA: Diagnosis not present

## 2023-06-30 HISTORY — PX: FLEXOR TENDON REPAIR: SHX6501

## 2023-06-30 HISTORY — PX: LIGAMENT REPAIR: SHX5444

## 2023-06-30 HISTORY — PX: SYNDESMOSIS REPAIR: SHX5182

## 2023-06-30 HISTORY — PX: ARTHROSCOPY, ANKLE WITH DEBRIDEMENT: SHX7318

## 2023-06-30 SURGERY — ARTHROSCOPY, ANKLE WITH DEBRIDEMENT
Anesthesia: Regional | Site: Ankle | Laterality: Left

## 2023-06-30 MED ORDER — CHLORHEXIDINE GLUCONATE 4 % EX SOLN: 60.0000 mL | Freq: Once | CUTANEOUS | Status: DC

## 2023-06-30 MED ORDER — LIDOCAINE 2% (20 MG/ML) 5 ML SYRINGE
INTRAMUSCULAR | Status: AC
  Filled 2023-06-30: qty 5

## 2023-06-30 MED ORDER — CEFAZOLIN SODIUM-DEXTROSE 2-4 GM/100ML-% IV SOLN
INTRAVENOUS | Status: AC
  Filled 2023-06-30: qty 100

## 2023-06-30 MED ORDER — PROPOFOL 10 MG/ML IV BOLUS
INTRAVENOUS | Status: AC
  Filled 2023-06-30: qty 20

## 2023-06-30 MED ORDER — DEXAMETHASONE SODIUM PHOSPHATE 10 MG/ML IJ SOLN
INTRAMUSCULAR | Status: AC
  Filled 2023-06-30: qty 1

## 2023-06-30 MED ORDER — PROPOFOL 10 MG/ML IV BOLUS
INTRAVENOUS | Status: DC | PRN
  Administered 2023-06-30: 150 mg via INTRAVENOUS

## 2023-06-30 MED ORDER — FENTANYL CITRATE (PF) 100 MCG/2ML IJ SOLN
INTRAMUSCULAR | Status: AC
Start: 2023-06-30 — End: 2023-06-30
  Filled 2023-06-30: qty 2

## 2023-06-30 MED ORDER — LACTATED RINGERS IV SOLN: INTRAVENOUS | Status: DC

## 2023-06-30 MED ORDER — KETOROLAC TROMETHAMINE 15 MG/ML IJ SOLN: 15.0000 mg | Freq: Once | INTRAMUSCULAR | Status: DC | PRN

## 2023-06-30 MED ORDER — 0.9 % SODIUM CHLORIDE (POUR BTL) OPTIME
TOPICAL | Status: DC | PRN
  Administered 2023-06-30: 300 mL

## 2023-06-30 MED ORDER — LIDOCAINE HCL (CARDIAC) PF 100 MG/5ML IV SOSY
PREFILLED_SYRINGE | INTRAVENOUS | Status: DC | PRN
  Administered 2023-06-30: 40 mg via INTRAVENOUS

## 2023-06-30 MED ORDER — ONDANSETRON HCL 4 MG/2ML IJ SOLN: 4.0000 mg | Freq: Once | INTRAMUSCULAR | Status: DC | PRN

## 2023-06-30 MED ORDER — ACETAMINOPHEN 500 MG PO TABS
ORAL_TABLET | ORAL | Status: AC
  Filled 2023-06-30: qty 2

## 2023-06-30 MED ORDER — VANCOMYCIN HCL 500 MG IV SOLR
INTRAVENOUS | Status: DC | PRN
  Administered 2023-06-30: 500 mg

## 2023-06-30 MED ORDER — AMISULPRIDE (ANTIEMETIC) 5 MG/2ML IV SOLN: 10.0000 mg | Freq: Once | INTRAVENOUS | Status: DC | PRN

## 2023-06-30 MED ORDER — DEXAMETHASONE SODIUM PHOSPHATE 10 MG/ML IJ SOLN
INTRAMUSCULAR | Status: DC | PRN
  Administered 2023-06-30: 5 mg via INTRAVENOUS

## 2023-06-30 MED ORDER — ONDANSETRON HCL 4 MG/2ML IJ SOLN
INTRAMUSCULAR | Status: AC
  Filled 2023-06-30: qty 2

## 2023-06-30 MED ORDER — BUPIVACAINE HCL (PF) 0.25 % IJ SOLN
INTRAMUSCULAR | Status: DC | PRN
  Administered 2023-06-30: 20 mL via PERINEURAL

## 2023-06-30 MED ORDER — CEFAZOLIN SODIUM-DEXTROSE 2-4 GM/100ML-% IV SOLN
2.0000 g | INTRAVENOUS | Status: AC
Start: 2023-06-30 — End: 2023-06-30
  Administered 2023-06-30: 2 g via INTRAVENOUS

## 2023-06-30 MED ORDER — FENTANYL CITRATE (PF) 100 MCG/2ML IJ SOLN: 25.0000 ug | INTRAMUSCULAR | Status: DC | PRN

## 2023-06-30 MED ORDER — POVIDONE-IODINE 10 % EX SOLN
CUTANEOUS | Status: DC | PRN
  Administered 2023-06-30: 1 via TOPICAL

## 2023-06-30 MED ORDER — ACETAMINOPHEN 500 MG PO TABS
1000.0000 mg | ORAL_TABLET | Freq: Once | ORAL | Status: AC
  Administered 2023-06-30: 1000 mg via ORAL

## 2023-06-30 MED ORDER — MIDAZOLAM HCL 2 MG/2ML IJ SOLN
1.0000 mg | Freq: Once | INTRAMUSCULAR | Status: AC
  Administered 2023-06-30: 1 mg via INTRAVENOUS

## 2023-06-30 MED ORDER — MIDAZOLAM HCL 2 MG/2ML IJ SOLN
INTRAMUSCULAR | Status: AC
Start: 2023-06-30 — End: 2023-06-30
  Filled 2023-06-30: qty 2

## 2023-06-30 MED ORDER — ONDANSETRON HCL 4 MG/2ML IJ SOLN
INTRAMUSCULAR | Status: DC | PRN
Start: 2023-06-30 — End: 2023-06-30
  Administered 2023-06-30: 4 mg via INTRAVENOUS

## 2023-06-30 MED ORDER — BUPIVACAINE-EPINEPHRINE (PF) 0.5% -1:200000 IJ SOLN
INTRAMUSCULAR | Status: DC | PRN
  Administered 2023-06-30: 30 mL via PERINEURAL

## 2023-06-30 SURGICAL SUPPLY — 65 items
BANDAGE ESMARK 6X9 LF (GAUZE/BANDAGES/DRESSINGS) IMPLANT
BIT DRILL 2.5 CANN STRL (BIT) IMPLANT
BLADE AVERAGE 25X9 (BLADE) IMPLANT
BLADE SURG 15 STRL LF DISP TIS (BLADE) ×2 IMPLANT
BNDG COHESIVE 4X5 TAN STRL LF (GAUZE/BANDAGES/DRESSINGS) IMPLANT
BNDG ELASTIC 6X10 VLCR STRL LF (GAUZE/BANDAGES/DRESSINGS) ×1 IMPLANT
BNDG GAUZE DERMACEA FLUFF 4 (GAUZE/BANDAGES/DRESSINGS) ×1 IMPLANT
CHLORAPREP W/TINT 26 (MISCELLANEOUS) ×1 IMPLANT
COVER BACK TABLE 60X90IN (DRAPES) ×1 IMPLANT
CUFF TRNQT CYL 34X4.125X (TOURNIQUET CUFF) ×1 IMPLANT
DRAPE EXTREMITY T 121X128X90 (DISPOSABLE) ×1 IMPLANT
DRAPE IMP U-DRAPE 54X76 (DRAPES) ×1 IMPLANT
DRAPE OEC MINIVIEW 54X84 (DRAPES) IMPLANT
DRAPE U-SHAPE 47X51 STRL (DRAPES) ×1 IMPLANT
DRSG MEPITEL 4X7.2 (GAUZE/BANDAGES/DRESSINGS) ×1 IMPLANT
ELECTRODE REM PT RTRN 9FT ADLT (ELECTROSURGICAL) ×1 IMPLANT
GAUZE PAD ABD 8X10 STRL (GAUZE/BANDAGES/DRESSINGS) ×3 IMPLANT
GAUZE SPONGE 4X4 12PLY STRL (GAUZE/BANDAGES/DRESSINGS) ×1 IMPLANT
GLOVE BIOGEL PI IND STRL 8 (GLOVE) ×2 IMPLANT
GLOVE SURG SS PI 7.5 STRL IVOR (GLOVE) ×2 IMPLANT
GOWN STRL REUS W/ TWL LRG LVL3 (GOWN DISPOSABLE) ×2 IMPLANT
GUIDEWIRE ORTH 157X1.6XTROC (WIRE) IMPLANT
KIT FIBERTAK DX 1.6 DISP (KITS) IMPLANT
NDL HYPO 22X1.5 SAFETY MO (MISCELLANEOUS) IMPLANT
NDL HYPO 25X1 1.5 SAFETY (NEEDLE) IMPLANT
NDL SAFETY ECLIPSE 18X1.5 (NEEDLE) ×1 IMPLANT
NDL SUT 6 .5 CRC .975X.05 MAYO (NEEDLE) IMPLANT
NEEDLE HYPO 22X1.5 SAFETY MO (MISCELLANEOUS) IMPLANT
NEEDLE HYPO 25X1 1.5 SAFETY (NEEDLE) IMPLANT
NS IRRIG 1000ML POUR BTL (IV SOLUTION) ×1 IMPLANT
PACK BASIN DAY SURGERY FS (CUSTOM PROCEDURE TRAY) ×1 IMPLANT
PAD CAST 4YDX4 CTTN HI CHSV (CAST SUPPLIES) ×1 IMPLANT
PADDING CAST SYNTHETIC 4X4 STR (CAST SUPPLIES) ×2 IMPLANT
PADDING CAST SYNTHETIC 6X4 NS (CAST SUPPLIES) ×3 IMPLANT
PASSER SUT SWANSON 36MM LOOP (INSTRUMENTS) IMPLANT
PENCIL SMOKE EVACUATOR (MISCELLANEOUS) ×1 IMPLANT
PLATE LOCK THIRD TUBULAR 4H (Plate) IMPLANT
RETRIEVER SUT HEWSON (MISCELLANEOUS) IMPLANT
SANITIZER HAND PURELL FF 515ML (MISCELLANEOUS) ×1 IMPLANT
SCOPE NANONDL 125 (MISCELLANEOUS) IMPLANT
SCOPE NANONEEDLE 125 (MISCELLANEOUS) ×1 IMPLANT
SCREW BONE KREULOCK 3.5X14 TI (Screw) IMPLANT
SCREW CORT LP ANKLE 3.5X55 (Screw) IMPLANT
SCREW LOCK COMPR LP 3.5X12 (Screw) IMPLANT
SET IRRIG Y TYPE TUR BLADDER L (SET/KITS/TRAYS/PACK) IMPLANT
SHEATH NANONDL HIGHFLOW 125 (SHEATH) IMPLANT
SHEATH NANONEEDLE HIGHFLOW 125 (SHEATH) ×1 IMPLANT
SHEET MEDIUM DRAPE 40X70 STRL (DRAPES) ×1 IMPLANT
SLEEVE SCD COMPRESS KNEE MED (STOCKING) ×1 IMPLANT
SPIKE FLUID TRANSFER (MISCELLANEOUS) IMPLANT
SPLINT FIBERGLASS 4X30 (CAST SUPPLIES) ×1 IMPLANT
SPONGE T-LAP 18X18 ~~LOC~~+RFID (SPONGE) ×1 IMPLANT
STAPLER SKIN PROX WIDE 3.9 (STAPLE) IMPLANT
STOCKINETTE 6 STRL (DRAPES) IMPLANT
SUT ETHIBOND 2 OS 4 DA (SUTURE) IMPLANT
SUT ETHILON 2 0 FS 18 (SUTURE) ×1 IMPLANT
SUT MNCRL AB 3-0 PS2 18 (SUTURE) IMPLANT
SUT VIC AB 0 CT1 27XBRD ANBCTR (SUTURE) IMPLANT
SUT VIC AB 2-0 CT1 TAPERPNT 27 (SUTURE) IMPLANT
SUT VICRYL 0 SH 27 (SUTURE) IMPLANT
SUTURE FIBERWR 2-0 18 17.9 3/8 (SUTURE) IMPLANT
SYR 20ML LL LF (SYRINGE) ×1 IMPLANT
SYR BULB EAR ULCER 3OZ GRN STR (SYRINGE) ×1 IMPLANT
TOWEL GREEN STERILE FF (TOWEL DISPOSABLE) ×2 IMPLANT
UNDERPAD 30X36 HEAVY ABSORB (UNDERPADS AND DIAPERS) ×1 IMPLANT

## 2023-06-30 NOTE — H&P (Signed)

## 2023-06-30 NOTE — Anesthesia Procedure Notes (Signed)
 Anesthesia Regional Block: Popliteal block   Pre-Anesthetic Checklist: , timeout performed,  Correct Patient, Correct Site, Correct Laterality,  Correct Procedure, Correct Position, site marked,  Risks and benefits discussed,  Surgical consent,  Pre-op evaluation,  At surgeon's request and post-op pain management  Laterality: Left  Prep: chloraprep       Needles:  Injection technique: Single-shot  Needle Type: Echogenic Stimulator Needle     Needle Length: 10cm  Needle Gauge: 20     Additional Needles:   Procedures:,,,, ultrasound used (permanent image in chart),,    Narrative:  Start time: 06/30/2023 12:00 PM End time: 06/30/2023 12:10 PM Injection made incrementally with aspirations every 5 mL.  Performed by: Personally  Anesthesiologist: Peggi Bowels, MD  Additional Notes: Functioning IV was confirmed and monitors were applied.  A timeout was performed. Sterile prep, hand hygiene and sterile gloves were used. A 20ga Bbraun echogenic stimulator needle was used. Negative aspiration and negative test dose prior to incremental administration of local anesthetic. The patient tolerated the procedure well.  Ultrasound guidance: relevent anatomy identified, needle position confirmed, local anesthetic spread visualized around nerve(s), vascular puncture avoided.  Image printed for medical record.

## 2023-06-30 NOTE — Anesthesia Postprocedure Evaluation (Signed)
 Anesthesia Post Note  Patient: Kevin Reyes  Procedure(s) Performed: ARTHROSCOPY, ANKLE WITH DEBRIDEMENT (Left: Ankle) REPAIR, SYNDESMOSIS, ANKLE (Left: Ankle) REPAIR, LIGAMENT (Left: Ankle) REPAIR, TENDON, FLEXOR (Left: Ankle)     Patient location during evaluation: PACU Anesthesia Type: Regional and General Level of consciousness: awake Pain management: pain level controlled Vital Signs Assessment: post-procedure vital signs reviewed and stable Respiratory status: spontaneous breathing, nonlabored ventilation and respiratory function stable Cardiovascular status: blood pressure returned to baseline and stable Postop Assessment: no apparent nausea or vomiting Anesthetic complications: no   No notable events documented.  Last Vitals:  Vitals:   06/30/23 1445 06/30/23 1501  BP:  (!) 151/74  Pulse: 63 (!) 53  Resp: 12 16  Temp: (!) 36.3 C (!) 36.3 C  SpO2: 99% 97%    Last Pain:  Vitals:   06/30/23 1501  TempSrc:   PainSc: 0-No pain                 [name]

## 2023-06-30 NOTE — Anesthesia Procedure Notes (Signed)
 Anesthesia Regional Block: Adductor canal block   Pre-Anesthetic Checklist: , timeout performed,  Correct Patient, Correct Site, Correct Laterality,  Correct Procedure,, site marked,  Risks and benefits discussed,  Surgical consent,  Pre-op evaluation,  At surgeon's request and post-op pain management  Laterality: Left  Prep: chloraprep       Needles:  Injection technique: Single-shot  Needle Type: Echogenic Stimulator Needle     Needle Length: 10cm  Needle Gauge: 20     Additional Needles:   Procedures:,,,, ultrasound used (permanent image in chart),,    Narrative:  Start time: 06/30/2023 12:10 PM End time: 06/30/2023 12:20 PM Injection made incrementally with aspirations every 5 mL.  Performed by: Personally  Anesthesiologist: Peggi Bowels, MD  Additional Notes: Functioning IV was confirmed and monitors were applied. A time-out was performed. Hand hygiene and sterile gloves were used. The thigh was placed in a frog-leg position and prepped in a sterile fashion. A 20ga Bbraun echogenic stimulator needle was placed using ultrasound guidance.  Negative aspiration and negative test dose prior to incremental administration of local anesthetic. The patient tolerated the procedure well.

## 2023-06-30 NOTE — Op Note (Signed)
 06/30/2023  4:53 PM   PATIENT: Kevin Reyes  68 y.o. male  MRN: [id number]   PRE-OPERATIVE DIAGNOSIS:   Sprain of tibiofibular and deltoid ligaments of left ankle, initial encounter   POST-OPERATIVE DIAGNOSIS:   Same   PROCEDURE: 1] Left ankle arthroscopic assisted debridement 2] Left ankle syndesmosis open reduction internal fixation 3] Left ankle open deltoid ligament reconstruction   SURGEON:  Ali Ink, MD   ASSISTANT: None   ANESTHESIA: General, regional   EBL: Minimal   TOURNIQUET:    Total Tourniquet Time Documented: Thigh (Left) - 68 minutes Total: Thigh (Left) - 68 minutes    COMPLICATIONS: None apparent   DISPOSITION: Extubated, awake and stable to recovery.   INDICATION FOR PROCEDURE: The patient presented with above diagnosis.  We discussed the diagnosis, alternative treatment options, risks and benefits of the above surgical intervention, as well as alternative non-operative treatments. All questions/concerns were addressed and the patient/family demonstrated appropriate understanding of the diagnosis, the procedure, the postoperative course, and overall prognosis. The patient wished to proceed with surgical intervention and signed an informed surgical consent as such, in each others presence prior to surgery.   PROCEDURE IN DETAIL: After preoperative consent was obtained and the correct operative site was identified, the patient was brought to the operating room supine on stretcher and transferred onto operating table. General anesthesia was induced. Preoperative antibiotics were administered. Surgical timeout was taken. The patient was then positioned supine with an ipsilateral hip bump. The operative lower extremity was prepped and draped in standard sterile fashion with a tourniquet around the thigh. The extremity was exsanguinated and the tourniquet was inflated to 275 mmHg.  Routine evaluation of the ankle joint demonstrated gross  lateral laxity with drawer testing. Full dorsiflexion as well as plantarflexion was possible.   We began by insufflating the ankle joint thru anteromedial approach. The anteromedial portal was carefully established medial to the tibialis anterior tendon. The arthroscopic trochar with blunt was inserted and then camera placed. There was excellent visualization of the joint and routine diagnostic ankle arthroscopy was performed. Of note, there was mild synovitis throughout the joint noted. Mild chondral changes in the tibiotalar joint surfaces. No loose bodies were encountered and no anterior ankle impingement was identified on max dorsiflexion. The deltoid and syndesmosis ligaments were stressed and noted to be grossly unstable. Arthroscopic assisted debridement of the ankle joint was performed.    A standard longitudinal approach was made over the distal lateral malleolus.    A manual external rotation stress radiograph was obtained and demonstrated widening of the ankle mortise. Given this intraoperative finding as well as preoperative subluxation, it was decided to reduce and fix the syndesmosis. Therefore a suture fixation system (TightRope device) was implanted through the Arthrex fibula plate in cannulated fashion to fix the syndesmosis. Anchor/button position was verified along anteromedial tibial cortex by fluoroscopy. A repeat stress radiograph showed complete stability of the ankle mortise to testing. This was further reinforced by implantation of a 3.5 non locking screw and complete tension of the TightRope was re-verified after screw placement.  We then turned our attention to the deltoid ligament. We made a longitudinal incision over the distal medial malleolus and deltoid ligament. Dissection was carefully carried down to the level of the deltoid ligament. Arthrex all-suture 1.8 mm anchor was implanted and the deltoid was retensioned with excellent congruency of ankle mortise at rest and to  stress testing.   The surgical sites were thoroughly irrigated. The tourniquet was deflated and  hemostasis achieved. Betadine  and vancomycin powder were applied. The deep layers were closed using 2-0 vicryl. The skin was closed without tension.    The leg was cleaned with saline and sterile dressings with gauze were applied. A well padded bulky short leg splint was applied. The patient was awakened from anesthesia and transported to the recovery room in stable condition.    FOLLOW UP PLAN: -transfer to PACU, then home -strict NWB operative extremity, maximum elevation -maintain short leg splint until follow up -DVT ppx: Aspirin  81 mg twice daily -follow up as outpatient within 7-10 days for wound check with exchange of short leg splint to short leg cast -sutures out in 2-3 weeks in outpatient office   RADIOGRAPHS: AP, lateral, oblique and stress radiographs of the left ankle were obtained intraoperatively. These showed interval reduction and fixation of the ankle syndesmosis. Manual stress radiographs were taken and the joints were noted to be stable following fixation. All hardware is appropriately positioned and of the appropriate lengths. No other acute injuries are noted.   Ali Ink Orthopaedic Surgery [facility]

## 2023-06-30 NOTE — Transfer of Care (Signed)
 Immediate Anesthesia Transfer of Care Note  Patient: Kevin Reyes  Procedure(s) Performed: ARTHROSCOPY, ANKLE WITH DEBRIDEMENT (Left: Ankle) REPAIR, SYNDESMOSIS, ANKLE (Left: Ankle) REPAIR, LIGAMENT (Left: Ankle) REPAIR, TENDON, FLEXOR (Left: Ankle)  Patient Location: PACU  Anesthesia Type:GA combined with regional for post-op pain  Level of Consciousness: drowsy  Airway & Oxygen Therapy: Patient Spontanous Breathing and Patient connected to face mask oxygen  Post-op Assessment: Report given to RN and Post -op Vital signs reviewed and stable  Post vital signs: Reviewed and stable  Last Vitals:  Vitals Value Taken Time  BP 154/72 (97)   Temp    Pulse 70   Resp 19   SpO2 100     Last Pain:  Vitals:   06/30/23 1119  TempSrc: Temporal  PainSc: 0-No pain      Patients Stated Pain Goal: 3 (06/30/23 1119)  Complications: No notable events documented.

## 2023-06-30 NOTE — Progress Notes (Signed)
Assisted Dr. Roanna Banning with left, adductor canal, popliteal, ultrasound guided block. Side rails up, monitors on throughout procedure. See vital signs in flow sheet. Tolerated Procedure well.

## 2023-06-30 NOTE — Anesthesia Preprocedure Evaluation (Addendum)
 Anesthesia Evaluation  Patient identified by MRN, date of birth, ID band Patient awake    Reviewed: Allergy & Precautions, NPO status , Patient's Chart, lab work & pertinent test results  Airway Mallampati: II  TM Distance: >3 FB Neck ROM: Full    Dental no notable dental hx.    Pulmonary neg pulmonary ROS   Pulmonary exam normal        Cardiovascular negative cardio ROS Normal cardiovascular exam     Neuro/Psych  Headaches  Anxiety        GI/Hepatic Neg liver ROS,GERD  Medicated and Controlled,,  Endo/Other  negative endocrine ROS    Renal/GU negative Renal ROS     Musculoskeletal  (+) Arthritis ,    Abdominal   Peds  Hematology negative hematology ROS (+)   Anesthesia Other Findings Sprain of tibiofibular ligament of left ankle, initial encounter  Reproductive/Obstetrics                              Anesthesia Physical Anesthesia Plan  ASA: 2  Anesthesia Plan: General and Regional   Post-op Pain Management: Regional block*   Induction: Intravenous  PONV Risk Score and Plan: 2 and Ondansetron , Dexamethasone , Midazolam  and Treatment may vary due to age or medical condition  Airway Management Planned: LMA  Additional Equipment:   Intra-op Plan:   Post-operative Plan: Extubation in OR  Informed Consent: I have reviewed the patients History and Physical, chart, labs and discussed the procedure including the risks, benefits and alternatives for the proposed anesthesia with the patient or authorized representative who has indicated his/her understanding and acceptance.     Dental advisory given  Plan Discussed with: CRNA  Anesthesia Plan Comments:          Anesthesia Quick Evaluation

## 2023-06-30 NOTE — Anesthesia Procedure Notes (Signed)
 Procedure Name: LMA Insertion Date/Time: 06/30/2023 1:08 PM  Performed by: Noralyn Beams, CRNAPre-anesthesia Checklist: Patient identified, Emergency Drugs available, Suction available and Patient being monitored Patient Re-evaluated:Patient Re-evaluated prior to induction Oxygen Delivery Method: Circle system utilized Preoxygenation: Pre-oxygenation with 100% oxygen Induction Type: IV induction Ventilation: Mask ventilation without difficulty LMA: LMA inserted LMA Size: 5.0 Number of attempts: 1 Airway Equipment and Method: Bite block Placement Confirmation: positive ETCO2 Tube secured with: Tape Dental Injury: Teeth and Oropharynx as per pre-operative assessment

## 2023-07-01 ENCOUNTER — Encounter (HOSPITAL_BASED_OUTPATIENT_CLINIC_OR_DEPARTMENT_OTHER): Payer: Self-pay | Admitting: Orthopaedic Surgery

## 2023-07-06 ENCOUNTER — Ambulatory Visit: Admitting: Physician Assistant

## 2023-07-06 NOTE — Progress Notes (Deleted)
 07/06/2023 Kevin Reyes [id number] 01-Jan-1956  Referring provider: Cleotilde Planas, MD Primary GI doctor: Dr. Charlanne  ASSESSMENT AND PLAN:  GERD with history of grade B esophagitis with a [facility] 12/18/2021 EGD grade B esophagitis, hiatal hernia 05/2022 gastric emptying study normal pantoprazole  40 mg BID, added Pepcid  at night Celiac weakly positive, did month gluten-free without any help   IBS-D  Neg US , MRI, and HIDA 2023.   Associated anxiety. Alpha gal negative Celiac weakly positive, did month gluten-free without any help   MASH/NASH on imaging with normal liver enzymes and platelets.  NASH Fibrosure-02/24/2022-F0 (no fibrosis), S1 (mild steatosis).  Nash score 0.62/N2-moderate NASH    Latest Ref Rng & Units 02/13/2021    4:30 PM 03/25/2016    7:32 AM 05/09/2014    7:58 AM  Hepatic Function  Total Protein 6.0 - 8.5 g/dL  6.3  6.4   Albumin 3.6 - 4.8 g/dL  4.0  4.0   AST 0 - 40 IU/L  27  13   ALT 0 - 44 IU/L 25  24  21    Alk Phosphatase 39 - 117 IU/L  65  66   Total Bilirubin 0.0 - 1.2 mg/dL  0.7  0.5   Bilirubin, Direct 0.0 - 0.3 mg/dL   0.1    Platelets 750  FIB 4 *** Patients less than 1.45% are low risk, counsel and monitor.  Patients above 3.25% need referral for evaluation.  Patients between 1.45 and 3.25 can get fibroscan/evaluate.  - repeat imaging with US  elastography - need LFTs and CBC monitored every 6 months, - evaluation with imaging every 2-3 years.  -Continue to work on risk factor modification including diet exercise and control of risk factors including blood sugars. - cessation of alcohol  H/O colon polyps.  Polyp(TA) removed in 2020.  Surveillance colonoscopy due 2027  Patient Care Team: Cleotilde Planas, MD as PCP - General (Family Medicine) Jeffrie Oneil BROCKS, MD as Consulting Physician (Cardiology)  HISTORY OF PRESENT ILLNESS: 68 y.o. male with a past medical history listed below presents for evaluation of ***.   Patient last seen in the office  05/05/2022 by Dr. Charlanne for history of IBS with diarrhea, GERD with esophagitis and small hiatal hernia, mesh and history of colon polyps  *** Discussed the use of AI scribe software for clinical note transcription with the patient, who gave verbal consent to proceed.  History of Present Illness            Kevin Reyes  reports that Kevin Reyes has never smoked. Kevin Reyes has never used smokeless tobacco. Kevin Reyes reports current alcohol use of about 4.0 standard drinks of alcohol per week. Kevin Reyes reports that Kevin Reyes does not use drugs.  RELEVANT GI HISTORY, IMAGING AND LABS: Results          CBC    Component Value Date/Time   WBC 23.4 (H) 01/21/2022 0726   RBC 4.56 01/21/2022 0726   HGB 13.8 01/21/2022 0726   HCT 40.7 01/21/2022 0726   PLT 249 01/21/2022 0726   MCV 89.3 01/21/2022 0726   MCH 30.3 01/21/2022 0726   MCHC 33.9 01/21/2022 0726   RDW 13.0 01/21/2022 0726   No results for input(s): HGB in the last 8760 hours.  CMP     Component Value Date/Time   NA 136 01/21/2022 0726   NA 140 02/13/2021 1630   K 4.4 01/21/2022 0726   CL 106 01/21/2022 0726   CO2 23 01/21/2022 0726   GLUCOSE 128 (H)  01/21/2022 0726   BUN 26 (H) 01/21/2022 0726   BUN 17 02/13/2021 1630   CREATININE 1.17 01/21/2022 0726   CALCIUM  8.8 (L) 01/21/2022 0726   PROT 6.3 03/25/2016 0732   ALBUMIN 4.0 03/25/2016 0732   AST 27 03/25/2016 0732   ALT 25 02/13/2021 1630   ALKPHOS 65 03/25/2016 0732   BILITOT 0.7 03/25/2016 0732   GFRNONAA >60 01/21/2022 0726   GFRAA 88 03/25/2016 0732      Latest Ref Rng & Units 02/13/2021    4:30 PM 03/25/2016    7:32 AM 05/09/2014    7:58 AM  Hepatic Function  Total Protein 6.0 - 8.5 g/dL  6.3  6.4   Albumin 3.6 - 4.8 g/dL  4.0  4.0   AST 0 - 40 IU/L  27  13   ALT 0 - 44 IU/L 25  24  21    Alk Phosphatase 39 - 117 IU/L  65  66   Total Bilirubin 0.0 - 1.2 mg/dL  0.7  0.5   Bilirubin, Direct 0.0 - 0.3 mg/dL   0.1       Current Medications:    Current Outpatient Medications  (Cardiovascular):    atorvastatin  (LIPITOR) 20 MG tablet, TAKE 1 TABLET BY MOUTH DAILY AT 6 PM.   sildenafil (VIAGRA) 100 MG tablet, Take 100 mg by mouth as needed for erectile dysfunction.  Current Outpatient Medications (Respiratory):    cetirizine (ZYRTEC) 10 MG tablet, Take 10 mg by mouth daily.    Current Outpatient Medications (Other):    busPIRone  (BUSPAR ) 10 MG tablet, Take 10 mg by mouth 3 (three) times daily.   famotidine  (PEPCID ) 40 MG tablet, TAKE 1 TABLET BY MOUTH EVERYDAY AT BEDTIME   finasteride  (PROSCAR ) 5 MG tablet, Take 5 mg by mouth daily.   gabapentin  (NEURONTIN ) 300 MG capsule, Take 600 mg by mouth at bedtime.   pantoprazole  (PROTONIX ) 40 MG tablet, Take 1 tablet (40 mg total) by mouth 2 (two) times daily.   triamcinolone  cream (KENALOG) 0.1 %, SMARTSIG:1 sparingly Topical Twice Daily   Varenicline Tartrate (TYRVAYA NA), Place 1 drop into both eyes in the morning and at bedtime.   Varenicline Tartrate (TYRVAYA NA), Place into the nose 2 (two) times daily.  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    Allergies: No Known Allergies   Surgical History:  Kevin Reyes  has a past surgical history that includes US  ECHOCARDIOGRAPHY (05/08/2008); US  ECHOCARDIOGRAPHY (08/23/2002); Knee arthroscopy (Right); Drug induced endoscopy; Colonoscopy; Partial knee arthroplasty (Right, 01/20/2022); Arthroscopy, ankle with debridement (Left, 06/30/2023); Syndesmosis repair (Left, 06/30/2023); Ligament repair (Left, 06/30/2023); and Flexor tendon repair (Left, 06/30/2023). Family History:  His family history includes Colon cancer in  his maternal aunt; Hypertension in his mother.  REVIEW OF SYSTEMS  : All other systems reviewed and negative except where noted in the History of Present Illness.  PHYSICAL EXAM: There were no vitals taken for this visit. Physical Exam          Alan SAUNDERS  Craig, PA-C 12:33 PM

## 2023-07-09 DIAGNOSIS — S93422D Sprain of deltoid ligament of left ankle, subsequent encounter: Secondary | ICD-10-CM | POA: Diagnosis not present

## 2023-07-09 DIAGNOSIS — S93432D Sprain of tibiofibular ligament of left ankle, subsequent encounter: Secondary | ICD-10-CM | POA: Diagnosis not present

## 2023-07-14 ENCOUNTER — Ambulatory Visit (HOSPITAL_COMMUNITY)

## 2023-07-23 DIAGNOSIS — S93422A Sprain of deltoid ligament of left ankle, initial encounter: Secondary | ICD-10-CM | POA: Diagnosis not present

## 2023-07-23 DIAGNOSIS — S93432A Sprain of tibiofibular ligament of left ankle, initial encounter: Secondary | ICD-10-CM | POA: Diagnosis not present

## 2023-08-10 DIAGNOSIS — S93422D Sprain of deltoid ligament of left ankle, subsequent encounter: Secondary | ICD-10-CM | POA: Diagnosis not present

## 2023-08-10 DIAGNOSIS — S93432D Sprain of tibiofibular ligament of left ankle, subsequent encounter: Secondary | ICD-10-CM | POA: Diagnosis not present

## 2023-08-24 ENCOUNTER — Other Ambulatory Visit (INDEPENDENT_AMBULATORY_CARE_PROVIDER_SITE_OTHER)

## 2023-08-24 ENCOUNTER — Encounter: Payer: Self-pay | Admitting: Physician Assistant

## 2023-08-24 ENCOUNTER — Ambulatory Visit (INDEPENDENT_AMBULATORY_CARE_PROVIDER_SITE_OTHER): Admitting: Physician Assistant

## 2023-08-24 VITALS — BP 120/60 | HR 53 | Ht 71.0 in | Wt 193.8 lb

## 2023-08-24 DIAGNOSIS — K259 Gastric ulcer, unspecified as acute or chronic, without hemorrhage or perforation: Secondary | ICD-10-CM

## 2023-08-24 DIAGNOSIS — K7581 Nonalcoholic steatohepatitis (NASH): Secondary | ICD-10-CM | POA: Diagnosis not present

## 2023-08-24 DIAGNOSIS — Z860101 Personal history of adenomatous and serrated colon polyps: Secondary | ICD-10-CM

## 2023-08-24 DIAGNOSIS — K760 Fatty (change of) liver, not elsewhere classified: Secondary | ICD-10-CM

## 2023-08-24 DIAGNOSIS — K580 Irritable bowel syndrome with diarrhea: Secondary | ICD-10-CM | POA: Diagnosis not present

## 2023-08-24 DIAGNOSIS — K2100 Gastro-esophageal reflux disease with esophagitis, without bleeding: Secondary | ICD-10-CM | POA: Diagnosis not present

## 2023-08-24 DIAGNOSIS — K449 Diaphragmatic hernia without obstruction or gangrene: Secondary | ICD-10-CM | POA: Diagnosis not present

## 2023-08-24 DIAGNOSIS — K219 Gastro-esophageal reflux disease without esophagitis: Secondary | ICD-10-CM

## 2023-08-24 DIAGNOSIS — K2980 Duodenitis without bleeding: Secondary | ICD-10-CM

## 2023-08-24 MED ORDER — FAMOTIDINE 40 MG PO TABS: 40.0000 mg | ORAL_TABLET | Freq: Every day | ORAL | 4 refills | Status: AC

## 2023-08-24 MED ORDER — PANTOPRAZOLE SODIUM 40 MG PO TBEC: 40.0000 mg | DELAYED_RELEASE_TABLET | Freq: Two times a day (BID) | ORAL | 3 refills | Status: AC

## 2023-08-24 NOTE — Progress Notes (Signed)
 08/24/2023 Kevin Reyes [id number] Jan 13, 1956  Referring provider: Cleotilde Planas, MD Primary GI doctor: Dr. Charlanne  ASSESSMENT AND PLAN:  LA Class B reflux esophagitis with a small hiatal hernia. 12/22/2021 EGD Dr. Eda LA grade B esophagitis, single small polyp nonbleeding distal to GE junction, gastritis, small HH diffuse mildly erythematous mucosa duodenal bulb and second portion, negative H. pylori, negative celiac Having nocturnal symptoms despite pantoprazole 40 mg BID.  05/05/2022 alpha gal weakly positive, celiac weakly positive TTG negative  05/29/2022 GES normal Tried gluten free diet without help On pantoprazole 40 mg BID and pepcid 40 mg once and well controlled, refilled medications Given information   IBS-D. Abdominal pain.  2023 ultrasound unremarkable other than fatty liver 08/2021 MRI abdomen with and without moderate to marked fatty steatosis no hepatic lesions 11/2021 HIDA ejection fraction 47% 05/29/2022 GES normal Associated anxiety. Resolved, no issues at this time   Hyperplastic gastric polyp.  No endoscopic follow-up indicated.    MASH/NASH on imaging with normal liver enzymes and platelets 04/2023 AST 21, ALT 20 No ETOH or rare, no family history of liver issues Fibrosure-02/24/2022-F0 (no fibrosis), S1 (mild steatosis).   Nash score 0.62/N2-moderate NASH    Latest Ref Rng & Units 02/13/2021    4:30 PM 03/25/2016    7:32 AM 05/09/2014    7:58 AM  Hepatic Function  Total Protein 6.0 - 8.5 g/dL  6.3  6.4   Albumin 3.6 - 4.8 g/dL  4.0  4.0   AST 0 - 40 IU/L  27  13   ALT 0 - 44 IU/L 25  24  21    Alk Phosphatase 39 - 117 IU/L  65  66   Total Bilirubin 0.0 - 1.2 mg/dL  0.7  0.5   Bilirubin, Direct 0.0 - 0.3 mg/dL   0.1    Platelets 750  - FIB4 lab - need LFTs and CBC monitored every 6 months, - evaluation with imaging every 2-3 years.  - Encouraged diet/exercise for modest 10% body weight loss as treatment for hepatic steatosis -Continue to work  on risk factor modification including diet exercise and control of risk factors including blood sugars. - cessation of alcohol   H/O colon polyps.  Polyp(TA) removed in 08/01/2018.  Surveillance colonoscopy due 07/31/2025.  Will put in recall for colonoscopy  Patient Care Team: Kevin Planas, MD as PCP - General (Family Medicine) Kevin Oneil BROCKS, MD as Consulting Physician (Cardiology)  HISTORY OF PRESENT ILLNESS: 68 y.o. male with a past medical history listed below presents for evaluation of med refill.   Patient last seen in the office 05/05/2022 by Dr. Charlanne for IBS with diarrhea  Discussed the use of AI scribe software for clinical note transcription with the patient, who gave verbal consent to proceed.  History of Present Illness   Kevin Reyes is a 68 year old male with irritable bowel syndrome and fatty liver who presents for a gastroenterology follow-up.  He has a history of irritable bowel syndrome (IBS) characterized by diarrhea and abdominal discomfort, which was significant before April 2024. Since starting treatment, including medications, he no longer experiences these symptoms. Occasional diarrhea occurs but is infrequent and not concerning. He underwent a colonoscopy in July 2020, where a small polyp was removed. An endoscopy in December 2023 showed no signs of celiac disease, and he has not noticed any issues with gluten consumption.  He is currently taking pantoprazole twice daily and famotidine in the evening. He experiences heartburn only with  spicy foods. He reports that he does not have abdominal pain or spasms.  He has a history of fatty liver, with a FibroSure test in 2024 showing no fibrosis and mild steatosis. The MASH score indicated moderate steatosis. He is not aware of any recent monitoring of his liver function or platelets, although liver function tests in April 2025 were normal. He consumes alcohol infrequently, about once a week, and has no family history  of liver issues.  He underwent ankle surgery in early June 2025, which has been a prolonged recovery process. Post-surgery, he experienced constipation and noticed a little bit of blood, which he attributes to the surgery. He reports no current stomach pain.      He  reports that he has never smoked. He has never used smokeless tobacco. He reports current alcohol use of about 4.0 standard drinks of alcohol per week. He reports that he does not use drugs.  RELEVANT GI HISTORY, IMAGING AND LABS: Results   LABS FibroSure: No fibrosis, mild steatosis, moderate MASH score (2024)  DIAGNOSTIC Colonoscopy: Small polyp removed (08/01/2018) Endoscopy: No evidence of celiac disease (December 2023)      CBC    Component Value Date/Time   WBC 23.4 (H) 01/21/2022 0726   RBC 4.56 01/21/2022 0726   HGB 13.8 01/21/2022 0726   HCT 40.7 01/21/2022 0726   PLT 249 01/21/2022 0726   MCV 89.3 01/21/2022 0726   MCH 30.3 01/21/2022 0726   MCHC 33.9 01/21/2022 0726   RDW 13.0 01/21/2022 0726   No results for input(s): HGB in the last 8760 hours.  CMP     Component Value Date/Time   NA 136 01/21/2022 0726   NA 140 02/13/2021 1630   K 4.4 01/21/2022 0726   CL 106 01/21/2022 0726   CO2 23 01/21/2022 0726   GLUCOSE 128 (H) 01/21/2022 0726   BUN 26 (H) 01/21/2022 0726   BUN 17 02/13/2021 1630   CREATININE 1.17 01/21/2022 0726   CALCIUM  8.8 (L) 01/21/2022 0726   PROT 6.3 03/25/2016 0732   ALBUMIN 4.0 03/25/2016 0732   AST 27 03/25/2016 0732   ALT 25 02/13/2021 1630   ALKPHOS 65 03/25/2016 0732   BILITOT 0.7 03/25/2016 0732   GFRNONAA >60 01/21/2022 0726   GFRAA 88 03/25/2016 0732      Latest Ref Rng & Units 02/13/2021    4:30 PM 03/25/2016    7:32 AM 05/09/2014    7:58 AM  Hepatic Function  Total Protein 6.0 - 8.5 g/dL  6.3  6.4   Albumin 3.6 - 4.8 g/dL  4.0  4.0   AST 0 - 40 IU/L  27  13   ALT 0 - 44 IU/L 25  24  21    Alk Phosphatase 39 - 117 IU/L  65  66   Total Bilirubin 0.0 - 1.2  mg/dL  0.7  0.5   Bilirubin, Direct 0.0 - 0.3 mg/dL   0.1       Current Medications:    Current Outpatient Medications (Cardiovascular):    atorvastatin  (LIPITOR) 20 MG tablet, TAKE 1 TABLET BY MOUTH DAILY AT 6 PM.   sildenafil (VIAGRA) 100 MG tablet, Take 100 mg by mouth as needed for erectile dysfunction.  Current Outpatient Medications (Respiratory):    cetirizine (ZYRTEC) 10 MG tablet, Take 10 mg by mouth daily.    Current Outpatient Medications (Other):    busPIRone  (BUSPAR ) 10 MG tablet, Take 10 mg by mouth 3 (three) times daily.   finasteride  (  PROSCAR ) 5 MG tablet, Take 5 mg by mouth daily.   gabapentin  (NEURONTIN ) 300 MG capsule, Take 600 mg by mouth at bedtime.   triamcinolone  cream (KENALOG) 0.1 %, SMARTSIG:1 sparingly Topical Twice Daily   Varenicline Tartrate (TYRVAYA NA), Place 1 drop into both eyes in the morning and at bedtime.   famotidine  (PEPCID ) 40 MG tablet, Take 1 tablet (40 mg total) by mouth at bedtime.   pantoprazole  (PROTONIX ) 40 MG tablet, Take 1 tablet (40 mg total) by mouth 2 (two) times daily.  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    Allergies: No Known Allergies   Surgical History:  He  has a past surgical history that includes US  ECHOCARDIOGRAPHY (05/08/2008); US  ECHOCARDIOGRAPHY (08/23/2002); Knee arthroscopy (Right); Drug induced endoscopy; Colonoscopy; Partial knee arthroplasty (Right, 01/20/2022); Arthroscopy, ankle with debridement (Left, 06/30/2023); Syndesmosis repair (Left, 06/30/2023); Ligament repair (Left, 06/30/2023); and Flexor tendon repair (Left, 06/30/2023). Family  History:  His family history includes Colon cancer in his maternal aunt; Hypertension in his mother.  REVIEW OF SYSTEMS  : All other systems reviewed and negative except where noted in the History of Present Illness.  PHYSICAL EXAM: BP 120/60   Pulse (!) 53   Ht 5' 11 (1.803 m)   Wt 193 lb 12.8 oz (87.9 kg)   BMI 27.03 kg/m  Physical Exam   GENERAL APPEARANCE: Well nourished, in no apparent distress HEENT: No cervical lymphadenopathy, unremarkable thyroid , sclerae anicteric, conjunctiva pink RESPIRATORY: Respiratory effort normal, BS equal bilateral without rales, rhonchi, wheezing CARDIO: RRR with no MRGs, peripheral pulses intact ABDOMEN: Soft, non distended, active bowel sounds in all 4 quadrants, no tenderness to palpation, no rebound, no mass appreciated RECTAL: declines MUSCULOSKELETAL: Full ROM, normal gait, without edema SKIN: Dry, intact without rashes or lesions. No jaundice. NEURO: Alert, oriented, no focal deficits PSYCH: Cooperative, normal mood and affect.      Alan JONELLE Coombs, PA-C 3:43 PM

## 2023-08-24 NOTE — Patient Instructions (Addendum)
 Your provider has requested that you go to the basement level for lab work before leaving today. Press B on the elevator. The lab is located at the first door on the left as you exit the elevator.  Please follow up in 12 months. Give us  a call at [phone] to schedule an appointment.  Avoid spicy and acidic foods Avoid fatty foods Limit your intake of coffee, tea, alcohol, and carbonated drinks Work to maintain a healthy weight Keep the head of the bed elevated at least 3 inches with blocks or a wedge pillow if you are having any nighttime symptoms Stay upright for 2 hours after eating Avoid meals and snacks three to four hours before bedtime   Metabolic dysfunction associated seatohepatitis  Now the leading cause of liver failure in the [place] .  It is normally from such risk factors as obesity, diabetes, insulin resistance, high cholesterol, or metabolic syndrome.  The only definitive therapy is weight loss and exercise.   Suggest walking 20-30 mins daily.  Decreasing carbohydrates, increasing veggies.    Fatty Liver Fatty liver is the accumulation of fat in liver cells. It is also called hepatosteatosis or steatohepatitis. It is normal for your liver to contain some fat. If fat is more than 5 to 10% of your liver's weight, you have fatty liver.  There are often no symptoms (problems) for years while damage is still occurring. People often learn about their fatty liver when they have medical tests for other reasons. Fat can damage your liver for years or even decades without causing problems. When it becomes severe, it can cause fatigue, weight loss, weakness, and confusion. This makes you more likely to develop more serious liver problems. The liver is the largest organ in the body. It does a lot of work and often gives no warning signs when it is sick until late in a disease. The liver has many important jobs including: Breaking down foods. Storing vitamins, iron, and other  minerals. Making proteins. Making bile for food digestion. Breaking down many products including medications, alcohol and some poisons.  PROGNOSIS  Fatty liver may cause no damage or it can lead to an inflammation of the liver. This is, called steatohepatitis.  Over time the liver may become scarred and hardened. This condition is called cirrhosis. Cirrhosis is serious and may lead to liver failure or cancer. NASH is one of the leading causes of cirrhosis. About 10-20% of Americans have fatty liver and a smaller 2-5% has NASH.  TREATMENT  Weight loss, fat restriction, and exercise in overweight patients produces inconsistent results but is worth trying. Good control of diabetes may reduce fatty liver. Eat a balanced, healthy diet. Increase your physical activity. There are no medical or surgical treatments for a fatty liver or NASH, but improving your diet and increasing your exercise may help prevent or reverse some of the damage.

## 2023-08-25 ENCOUNTER — Ambulatory Visit (HOSPITAL_COMMUNITY)
Admission: RE | Admit: 2023-08-25 | Discharge: 2023-08-25 | Disposition: A | Discharge status: Home / Self Care | Source: Ambulatory Visit | Attending: Cardiovascular Disease | Admitting: Cardiovascular Disease

## 2023-08-25 ENCOUNTER — Ambulatory Visit: Payer: Self-pay | Admitting: Physician Assistant

## 2023-08-25 DIAGNOSIS — I340 Nonrheumatic mitral (valve) insufficiency: Secondary | ICD-10-CM | POA: Insufficient documentation

## 2023-08-25 LAB — FIB-4
ALT: 27 IU/L (ref 0–44)
AST: 26 IU/L (ref 0–40)
FIB-4 Index: 1.07 (ref 0.00–2.67)
Platelets: 312 x10E3/uL (ref 150–450)

## 2023-08-25 LAB — ECHOCARDIOGRAM COMPLETE: S' Lateral: 3.3 cm

## 2023-08-26 DIAGNOSIS — M25572 Pain in left ankle and joints of left foot: Secondary | ICD-10-CM | POA: Diagnosis not present

## 2023-08-26 DIAGNOSIS — M79672 Pain in left foot: Secondary | ICD-10-CM | POA: Diagnosis not present

## 2023-08-27 ENCOUNTER — Ambulatory Visit: Payer: Self-pay | Admitting: Cardiology

## 2023-08-31 DIAGNOSIS — M25572 Pain in left ankle and joints of left foot: Secondary | ICD-10-CM | POA: Diagnosis not present

## 2023-08-31 DIAGNOSIS — M79672 Pain in left foot: Secondary | ICD-10-CM | POA: Diagnosis not present

## 2023-09-07 DIAGNOSIS — M25572 Pain in left ankle and joints of left foot: Secondary | ICD-10-CM | POA: Diagnosis not present

## 2023-09-07 DIAGNOSIS — M79672 Pain in left foot: Secondary | ICD-10-CM | POA: Diagnosis not present

## 2023-09-14 DIAGNOSIS — M79672 Pain in left foot: Secondary | ICD-10-CM | POA: Diagnosis not present

## 2023-09-14 DIAGNOSIS — M25572 Pain in left ankle and joints of left foot: Secondary | ICD-10-CM | POA: Diagnosis not present

## 2023-09-21 DIAGNOSIS — S93432D Sprain of tibiofibular ligament of left ankle, subsequent encounter: Secondary | ICD-10-CM | POA: Diagnosis not present

## 2023-09-21 DIAGNOSIS — M79672 Pain in left foot: Secondary | ICD-10-CM | POA: Diagnosis not present

## 2023-09-21 DIAGNOSIS — M25572 Pain in left ankle and joints of left foot: Secondary | ICD-10-CM | POA: Diagnosis not present

## 2023-09-21 DIAGNOSIS — G5762 Lesion of plantar nerve, left lower limb: Secondary | ICD-10-CM | POA: Diagnosis not present

## 2023-11-23 DIAGNOSIS — L814 Other melanin hyperpigmentation: Secondary | ICD-10-CM | POA: Diagnosis not present

## 2023-11-23 DIAGNOSIS — L821 Other seborrheic keratosis: Secondary | ICD-10-CM | POA: Diagnosis not present

## 2023-11-23 DIAGNOSIS — C44319 Basal cell carcinoma of skin of other parts of face: Secondary | ICD-10-CM | POA: Diagnosis not present

## 2023-11-23 DIAGNOSIS — C44311 Basal cell carcinoma of skin of nose: Secondary | ICD-10-CM | POA: Diagnosis not present

## 2023-11-23 DIAGNOSIS — Z85828 Personal history of other malignant neoplasm of skin: Secondary | ICD-10-CM | POA: Diagnosis not present

## 2023-11-23 DIAGNOSIS — L570 Actinic keratosis: Secondary | ICD-10-CM | POA: Diagnosis not present

## 2023-12-28 DIAGNOSIS — R351 Nocturia: Secondary | ICD-10-CM | POA: Diagnosis not present

## 2024-01-11 DIAGNOSIS — N528 Other male erectile dysfunction: Secondary | ICD-10-CM | POA: Diagnosis not present

## 2024-01-11 DIAGNOSIS — R3912 Poor urinary stream: Secondary | ICD-10-CM | POA: Diagnosis not present
